# Patient Record
Sex: Male | Born: 1960 | Race: White | Hispanic: No | Marital: Married | Smoking: Never smoker
Health system: Southern US, Community
[De-identification: ages and names within clinical notes are randomized; demographics above are authoritative.]

## PROBLEM LIST (undated history)

## (undated) DIAGNOSIS — M1990 Unspecified osteoarthritis, unspecified site: Secondary | ICD-10-CM

## (undated) DIAGNOSIS — A4902 Methicillin resistant Staphylococcus aureus infection, unspecified site: Secondary | ICD-10-CM

## (undated) DIAGNOSIS — F419 Anxiety disorder, unspecified: Secondary | ICD-10-CM

## (undated) DIAGNOSIS — F329 Major depressive disorder, single episode, unspecified: Secondary | ICD-10-CM

## (undated) DIAGNOSIS — F32A Depression, unspecified: Secondary | ICD-10-CM

## (undated) HISTORY — DX: Anxiety disorder, unspecified: F41.9

## (undated) HISTORY — DX: Unspecified osteoarthritis, unspecified site: M19.90

## (undated) HISTORY — DX: Methicillin resistant Staphylococcus aureus infection, unspecified site: A49.02

## (undated) HISTORY — PX: DENTAL SURGERY: SHX609

---

## 1999-03-26 ENCOUNTER — Ambulatory Visit (HOSPITAL_COMMUNITY): Admission: RE | Admit: 1999-03-26 | Discharge: 1999-03-26 | Payer: Self-pay | Admitting: *Deleted

## 1999-03-26 ENCOUNTER — Encounter: Payer: Self-pay | Admitting: *Deleted

## 2000-01-11 ENCOUNTER — Emergency Department (HOSPITAL_COMMUNITY): Admission: EM | Admit: 2000-01-11 | Discharge: 2000-01-11 | Payer: Self-pay | Admitting: Emergency Medicine

## 2000-01-12 ENCOUNTER — Encounter: Payer: Self-pay | Admitting: Emergency Medicine

## 2011-12-02 ENCOUNTER — Ambulatory Visit (INDEPENDENT_AMBULATORY_CARE_PROVIDER_SITE_OTHER): Payer: 59 | Admitting: Family Medicine

## 2011-12-02 VITALS — BP 125/71 | HR 76 | Temp 98.5°F | Resp 16 | Ht 69.5 in | Wt 176.2 lb

## 2011-12-02 DIAGNOSIS — L02419 Cutaneous abscess of limb, unspecified: Secondary | ICD-10-CM

## 2011-12-02 DIAGNOSIS — L03119 Cellulitis of unspecified part of limb: Secondary | ICD-10-CM

## 2011-12-02 DIAGNOSIS — S61409A Unspecified open wound of unspecified hand, initial encounter: Secondary | ICD-10-CM

## 2011-12-02 MED ORDER — CIPROFLOXACIN HCL 500 MG PO TABS: 500.0000 mg | ORAL_TABLET | Freq: Two times a day (BID) | ORAL | Status: AC

## 2011-12-02 NOTE — Progress Notes (Signed)
  Patient Name: Nicholas Rodriguez Date of Birth: [birth year] Medical Record Number: [id number] Gender: male Date of Encounter: 12/02/2011  History of Present Illness:  Nicholas Rodriguez is a 51 y.o. very pleasant male patient who presents with the following:  Cut his right hand with a piece of glass on Friday night- today is Monday.  He does glass work regularly.  Still hurts some.  He notes no numbness in the hand.  He has not noted any weakness but he has not pushed himself to use his hand too much.  He also noted an area of ?cellulitis on his left calf 2 days ago.  Hurts a lot- reminds him of MRSA episodes.  This morning her used some heat to try and get it to drain.  He did "pop" it this am and drained some pus.    Otherwise he feels ok- no fever.   He had MRSA last august which was resistant to tetracyclines but susceptible to cipro He otherwise feels well- no fever- and is generally healthy  Tetanus shot was in 2012  There is no problem list on file for this patient.  No past medical history on file. No past surgical history on file. History  Substance Use Topics  . Smoking status: Never Smoker   . Smokeless tobacco: Not on file  . Alcohol Use: Not on file   No family history on file. No Known Allergies  Medication list has been reviewed and updated.  Review of Systems: As per HPI- otherwise negative.  Physical Examination: Filed Vitals:   12/02/11 1733  BP: 125/71  Pulse: 76  Temp: 98.5 F (36.9 C)  TempSrc: Oral  Resp: 16  Height: 5' 9.5" (1.765 m)  Weight: 176 lb 3.2 oz (79.924 kg)    Body mass index is 25.65 kg/(m^2).  GEN: WDWN, NAD, Non-toxic, A & O x 3 HEENT: Atraumatic, Normocephalic. Neck supple. No masses, No LAD. Ears and Nose: No external deformity. CV: RRR, No M/G/R. No JVD. No thrill. No extra heart sounds. PULM: CTA B, no wheezes, crackles, rhonchi. No retractions. No resp. distress. No accessory muscle use. ABD: S, NT, ND, +BS. No rebound. No  HSM. EXTR: No c/c/e NEURO Normal gait.  PSYCH: Normally interactive. Conversant. Not depressed or anxious appearing.  Calm demeanor.  Left calf has about 3inch diameter area of cellulitis with a central small wound- there is no fluctuance or pustule to culture.   Right hand has a healing laceration at the base of the ring finger.  It is already well closed.  The fingers have normal strength to flexion/ extension/ ab and adduction.  Normal sensation as well  Assessment and Plan: 1. Cellulitis, leg  ciprofloxacin (CIPRO) 500 MG tablet  2. Wound, open, hand with or without fingers      Hand wound seems to be healing well.  Offered referral to hand surgery, but he wants to see how it does.  Start cipro for cellulitis on leg as his last bout of mrsa was doxy resistant.  Continue heat- Patient (or parent if minor) instructed to return to clinic or call if not better in 2 day(s).  Sooner if worse.

## 2011-12-12 ENCOUNTER — Telehealth: Payer: Self-pay

## 2011-12-12 NOTE — Telephone Encounter (Signed)
Pt on cipro - would it be ok to switch him to different abx? Please advise.

## 2011-12-12 NOTE — Telephone Encounter (Signed)
Patient's last infection with MRSA was resistant to Doxycycline, so I am hesitant to make this change. Is he taking it with food? Is it diarrhea, nausea, or vomiting?  Carmon Sahli

## 2011-12-12 NOTE — Telephone Encounter (Signed)
Pt calling to ask if he could have another antibotic called in for his leg he said the one he is taking is tearing his stomach up

## 2011-12-13 NOTE — Telephone Encounter (Signed)
Pt just Nauseated and is taking the medication with food, pt is out of the cipro and if there is nothing else he can take in place of the cipro he states he will just deal with the nausea.

## 2011-12-13 NOTE — Telephone Encounter (Signed)
Pt reports his wound has really healed up a lot, but is not completely better. There is still a knot there and some redness, and is still sensitive to the touch. No draining/no fever. He feels like he needs the Abx a little longer for it to resolve, and if there is nothing else to use, he will take the Cipro and just deal with the nausea. Do you want to Rx something Dr Patsy Lager?

## 2011-12-13 NOTE — Telephone Encounter (Signed)
Called patient back- he still has redness, tenderness and heat.  I think he needs to come back in- may need I and D.  He agreed and will return tomorrow morning or Sunday morning if he cannot make it tomorrow

## 2011-12-15 ENCOUNTER — Ambulatory Visit (INDEPENDENT_AMBULATORY_CARE_PROVIDER_SITE_OTHER): Payer: 59 | Admitting: Physician Assistant

## 2011-12-15 ENCOUNTER — Telehealth: Payer: Self-pay | Admitting: Radiology

## 2011-12-15 VITALS — BP 122/67 | HR 47 | Temp 98.2°F | Resp 16 | Ht 69.75 in | Wt 175.0 lb

## 2011-12-15 DIAGNOSIS — L0390 Cellulitis, unspecified: Secondary | ICD-10-CM

## 2011-12-15 DIAGNOSIS — B9562 Methicillin resistant Staphylococcus aureus infection as the cause of diseases classified elsewhere: Secondary | ICD-10-CM

## 2011-12-15 DIAGNOSIS — L0291 Cutaneous abscess, unspecified: Secondary | ICD-10-CM

## 2011-12-15 DIAGNOSIS — L03119 Cellulitis of unspecified part of limb: Secondary | ICD-10-CM

## 2011-12-15 MED ORDER — SULFAMETHOXAZOLE-TRIMETHOPRIM 800-160 MG PO TABS: 1.0000 | ORAL_TABLET | Freq: Two times a day (BID) | ORAL | Status: AC

## 2011-12-15 NOTE — Telephone Encounter (Signed)
Pt needs appt with [facility] this Wed 12/18/11 in the am/ after 9 am, she needs to recheck his leg wound. See her with Questions, Shaconda Hajduk (phone [phone])

## 2011-12-15 NOTE — Patient Instructions (Signed)
See me Wednesday.  Follow wound care instructions

## 2011-12-15 NOTE — Progress Notes (Signed)
  Subjective:    Patient ID: Nicholas Rodriguez, male    DOB: 1961-02-23, 51 y.o.   MRN: [id number]  HPI50 yo CM here with unresolved abscess lower Left leg.  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 Patsy Lager had prescribed him for this most recent abscess.  The abscess wasn't fluctuant enough for I&D at that time, Cipro was started, and he finished it 3days ago.  Abscess seems to be fuller and more tender since finishing the cipro.  He did not tolerate the Cipro well; it caused him severe nausea. No f/c.  No pain in leg other than at location of abscess.  Review of Systems  All other systems reviewed and are negative.         Objective:   Physical Exam  Nursing note and vitals reviewed. Constitutional: He appears well-developed and well-nourished.  HENT:  Head: Normocephalic and atraumatic.  Skin:       Left lower leg-abscess ~ mid way down lateral calf ~3cm, erythema and slight induration, with mild central fluctuance.   Procedure-1% plain lidocaine 1.5 cc injected locally.  Betadine prep.  #11 blade to make 1 cm incision.  Some bloody, purulent drainage.  Culture taken.  1/4" plain guaze for packing.  Bandaged and ACE wrap    Assessment & Plan:  Cellulitis/abscess-C&S sent H/O Doxy resistant MRSA After reviewing culture results from 04/2011 and patient being unable to tolerate Cipro, will start Septra DS-1 bid, #3 tabs from stock bottle given.

## 2011-12-17 LAB — WOUND CULTURE
Gram Stain: NONE SEEN
Gram Stain: NONE SEEN
Gram Stain: NONE SEEN
Organism ID, Bacteria: NO GROWTH

## 2011-12-18 ENCOUNTER — Encounter: Payer: Self-pay | Admitting: Physician Assistant

## 2011-12-18 ENCOUNTER — Ambulatory Visit (INDEPENDENT_AMBULATORY_CARE_PROVIDER_SITE_OTHER): Payer: 59 | Admitting: Physician Assistant

## 2011-12-18 VITALS — BP 122/64 | HR 49 | Temp 97.7°F | Resp 16 | Ht 69.5 in | Wt 178.0 lb

## 2011-12-18 DIAGNOSIS — L02419 Cutaneous abscess of limb, unspecified: Secondary | ICD-10-CM

## 2011-12-18 NOTE — Progress Notes (Signed)
  Subjective:    Patient ID: Normajean Baxter, male    DOB: 25-Aug-1961, 51 y.o.   MRN: [id number]  HPI Here for leg abscess recheck.  Reviewed culture results which showed no growth.  Still having a lot of pain with it, esp if he is up walking, goes to climb a ladder, etc it feels as though its pulling apart. No f/c.  No streaking.  Denies any FB.  Review of Systems     Objective:   Physical Exam Removed dressing, packing still in place and opening has formed into an annular opening.  Packing is removed which is painful bc adhered to newly developing granulating tissue. Healing.  Much less surrounding erythema and skin near opening that was indurated is now soft. Muprircin 2% applied aseptically.  Redressed     Assessment & Plan:  Abscess-healing.  Continue antibiotics bc culture may have been compromised by recent antibiotics.  Gave some 2% mupircin to apply once daily.  No need to repack. Due to enlarging of incision site, it should heal well by secondary intention.  Elevate, relative rest X 2 days, recheck if not improving.

## 2012-01-25 ENCOUNTER — Ambulatory Visit: Payer: 59 | Admitting: Family Medicine

## 2012-01-25 VITALS — BP 115/68 | HR 50 | Temp 98.2°F | Resp 16 | Ht 71.0 in | Wt 175.0 lb

## 2012-01-25 DIAGNOSIS — L0291 Cutaneous abscess, unspecified: Secondary | ICD-10-CM

## 2012-01-25 DIAGNOSIS — L0390 Cellulitis, unspecified: Secondary | ICD-10-CM

## 2012-01-25 DIAGNOSIS — Z0289 Encounter for other administrative examinations: Secondary | ICD-10-CM

## 2012-01-25 MED ORDER — SULFAMETHOXAZOLE-TRIMETHOPRIM 800-160 MG PO TABS: 1.0000 | ORAL_TABLET | Freq: Two times a day (BID) | ORAL | Status: AC

## 2012-01-25 NOTE — Progress Notes (Signed)
  Subjective:    Patient ID: Nicholas Rodriguez, male    DOB: [birth year], 51 y.o.   MRN: [id number]  HPI 51 yo male here for physical exam for [employer].  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     Also, has suspicious spot on leg.  Multiple cases of MRSA abscess before.  IN August, it was doxy resistant.  Last was in April.  Took Bactrim.     Review of Systems Negative except as per HPI     Objective:   Physical Exam  Constitutional: Vital signs are normal. He appears well-developed and well-nourished. He is active.  HENT:  Head: Normocephalic.  Right Ear: Hearing, tympanic membrane, external ear and ear canal normal.  Left Ear: Hearing, tympanic membrane, external ear and ear canal normal.  Nose: Nose normal.  Mouth/Throat: Uvula is midline, oropharynx is clear and moist and mucous membranes are normal.  Eyes: Conjunctivae and EOM are normal. Pupils are equal, round, and reactive to light.  Neck: No mass and no thyromegaly present.  Cardiovascular: Normal rate, regular rhythm, normal heart sounds, intact distal pulses and normal pulses.   Pulmonary/Chest: Effort normal and breath sounds normal.  Abdominal: Soft. Normal appearance and bowel sounds are normal. There is no hepatosplenomegaly. There is no tenderness. There is no CVA tenderness. No hernia. Hernia confirmed negative in the right inguinal area and confirmed negative in the left inguinal area.  Genitourinary: Testes normal and penis normal.  Lymphadenopathy:    He has no cervical adenopathy.    He has no axillary adenopathy.  Neurological: He is alert.  Skin:       Right leg, over medial malleolus, small indurated, erythematous bump with surrounding erythema.  TTP.  No fluctuance.           Assessment & Plan:  PE - cleared for participation  Abscess with cellulitis - septra DS

## 2012-02-04 ENCOUNTER — Telehealth: Payer: Self-pay

## 2012-02-04 NOTE — Telephone Encounter (Signed)
Wife notified of immunizations

## 2012-02-04 NOTE — Telephone Encounter (Signed)
Patients wife is trying to fill out [employer] PE form and would like [name] to call her with dates of patients last Td and any Hep immunizations patient may of had here. Pt needs to turn in form today.

## 2012-03-20 ENCOUNTER — Telehealth: Payer: Self-pay

## 2012-03-20 NOTE — Telephone Encounter (Signed)
Patient believes that he has mrsa spot on left leg and would like to know if there is something that can be called in for him without having to come in for an office visit. Please contact on cell at [phone].

## 2012-03-21 ENCOUNTER — Ambulatory Visit: Payer: 59

## 2012-03-21 ENCOUNTER — Ambulatory Visit (INDEPENDENT_AMBULATORY_CARE_PROVIDER_SITE_OTHER): Payer: 59 | Admitting: Internal Medicine

## 2012-03-21 ENCOUNTER — Other Ambulatory Visit: Payer: Self-pay | Admitting: Internal Medicine

## 2012-03-21 VITALS — BP 120/70 | HR 56 | Temp 97.9°F | Resp 17 | Ht 70.0 in | Wt 169.0 lb

## 2012-03-21 DIAGNOSIS — M25529 Pain in unspecified elbow: Secondary | ICD-10-CM

## 2012-03-21 DIAGNOSIS — L03119 Cellulitis of unspecified part of limb: Secondary | ICD-10-CM

## 2012-03-21 DIAGNOSIS — B9562 Methicillin resistant Staphylococcus aureus infection as the cause of diseases classified elsewhere: Secondary | ICD-10-CM

## 2012-03-21 DIAGNOSIS — M7700 Medial epicondylitis, unspecified elbow: Secondary | ICD-10-CM

## 2012-03-21 MED ORDER — MUPIROCIN 2 % EX OINT: TOPICAL_OINTMENT | Freq: Three times a day (TID) | CUTANEOUS | Status: DC

## 2012-03-21 MED ORDER — SULFAMETHOXAZOLE-TRIMETHOPRIM 800-160 MG PO TABS: 1.0000 | ORAL_TABLET | Freq: Two times a day (BID) | ORAL | Status: AC

## 2012-03-21 NOTE — Progress Notes (Signed)
  Subjective:    Patient ID: Nicholas Rodriguez, male    DOB: November 13, 1960, 51 y.o.   MRN: [id number]  HPI Has new boil on left anterior thigh with pustule. Also c/o medial left elbow pain, tender to touch but not painful to use.   Review of Systems MRSA    Objective:   Physical Exam 1 cm abcess L thigh with pustule, c/s done after pus expressed L elbow point tender med epicondyle, has fullrom and NMS fuction with no pain  [facility] reading (PRIMARY) by  Dr.Guest medial lucency at tender area , no fx         Assessment & Plan:  MRSA Medial epicondylitis left elbow PT, home mrsa care and meds

## 2012-03-21 NOTE — Telephone Encounter (Signed)
Patient came in to be seen 

## 2012-03-21 NOTE — Patient Instructions (Addendum)
MRSA Overview MRSA stands for methicillin-resistant Staphylococcus aureus. It is a type of bacteria that is resistant to some common antibiotics. It can cause infections in the skin and many other places in the body. Staphylococcus aureus, often called "staph," is a bacteria that normally lives on the skin or in the nose. Staph on the surface of the skin or in the nose does not cause problems. However, if the staph enters the body through a cut, wound, or break in the skin, an infection can happen. Up until recently, infections with the MRSA type of staph mainly occurred in hospitals and other healthcare settings. There are now increasing problems with MRSA infections in the community as well. Infections with MRSA may be very serious or even life-threatening. Most MRSA infections are acquired in one of two ways:  Healthcare-associated MRSA (HA-MRSA)   This can be acquired by people in any healthcare setting. MRSA can be a big problem for hospitalized people, people in nursing homes, people in rehabilitation facilities, people with weakened immune systems, dialysis patients, and those who have had surgery.   Community-associated MRSA (CA-MRSA)   Community spread of MRSA is becoming more common. It is known to spread in crowded settings, in jails and prisons, and in situations where there is close skin-to-skin contact, such as during sporting events or in locker rooms. MRSA can be spread through shared items, such as children's toys, razors, towels, or sports equipment.  CAUSES  All staph, including MRSA, are normally harmless unless they enter the body through a scratch, cut, or wound, such as with surgery. All staph, including MRSA, can be spread from person-to-person by touching contaminated objects or through direct contact. SPECIAL GROUPS MRSA can present problems for special groups of people. Some of these groups include:  Breastfeeding women.   The most common problem is MRSA infection of the  breast (mastitis). There is evidence that MRSA can be passed to an infant from infected breast milk. Your caregiver may recommend that you stop breastfeeding until the mastitis is under control.   If you are breastfeeding and have a MRSA infection in a place other than the breast, you may usually continue breastfeeding while under treatment. If taking antibiotics, ask your caregiver if it is safe to continue breastfeeding while taking your prescribed medicines.   Neonates (babies from birth to 1 month old) and infants (babies from 1 month to 1 year old).   There is evidence that MRSA can be passed to a newborn at birth if the mother has MRSA on the skin, in or around the birth canal, or an infection in the uterus, cervix, or vagina. MRSA infection can have the same appearance as a normal newborn or infant rash or several other skin infections. This can make it hard to diagnose MRSA.   Immune compromised people.   If you have an immune system problem, you may have a higher chance of developing a MRSA infection.   People after any type of surgery.   Staph in general, including MRSA, is the most common cause of infections occurring at the site of recent surgery.   People on long-term steroid medicines.   These kinds of medicines can lower your resistance to infection. This can increase your chance of getting MRSA.   People who have had frequent hospitalizations, live in nursing homes or other residential care facilities, have venous or urinary catheters, or have taken multiple courses of antibiotic therapy for any reason.  DIAGNOSIS  Diagnosis of MRSA is   done by cultures of fluid samples that may come from:  Swabs taken from cuts or wounds in infected areas.   Nasal swabs.   Saliva or deep cough specimens from the lungs (sputum).   Urine.   Blood.  Many people are "colonized" with MRSA but have no signs of infection. This means that people carry the MRSA germ on their skin or in their  nose and may never develop MRSA infection.  TREATMENT  Treatment varies and is based on how serious, how deep, or how extensive the infection is. For example:  Some skin infections, such as a small boil or abscess, may be treated by draining yellowish-white fluid (pus) from the site of the infection.   Deeper or more widespread soft tissue infections are usually treated with surgery to drain pus and with antibiotic medicine given by vein or by mouth. This may be recommended even if you are pregnant.   Serious infections may require a hospital stay.  If antibiotics are given, they may be needed for several weeks. PREVENTION  Because many people are colonized with staph, including MRSA, preventing the spread of the bacteria from person-to-person is most important. The best way to prevent the spread of bacteria and other germs is through proper hand washing or by using alcohol-based hand disinfectants. The following are other ways to help prevent MRSA infection within the hospital and community settings.   Healthcare settings:   Strict hand washing or hand disinfection procedures need to be followed before and after touching every patient.   Patients infected with MRSA are placed in isolation to prevent the spread of the bacteria.   Healthcare workers need to wear disposable gowns and gloves when touching or caring for patients infected with MRSA. Visitors may also be asked to wear a gown and gloves.   Hospital surfaces need to be disinfected frequently.   Community settings:   Wash your hands frequently with soap and water for at least 15 seconds. Otherwise, use alcohol-based hand disinfectants when soap and water is not available.   Make sure people who live with you wash their hands often, too.   Do not share personal items. For example, avoid sharing razors and other personal hygiene items, towels, clothing, and athletic equipment.   Wash and dry your clothes and bedding at the  warmest temperatures recommended on the labels.   Keep wounds covered. Pus from infected sores may contain MRSA and other bacteria. Keep cuts and abrasions clean and covered with germ-free (sterile), dry bandages until they are healed.   If you have a wound that appears infected, ask your caregiver if a culture for MRSA and other bacteria should be done.   If you are breastfeeding, talk to your caregiver about MRSA. You may be asked to temporarily stop breastfeeding.  HOME CARE INSTRUCTIONS   Take your antibiotics as directed. Finish them even if you start to feel better.   Avoid close contact with those around you as much as possible. Do not use towels, razors, toothbrushes, bedding, or other items that will be used by others.   To fight the infection, follow your caregiver's instructions for wound care. Wash your hands before and after changing your bandages.   If you have an intravascular device, such as a catheter, make sure you know how to care for it.   Be sure to tell any healthcare providers that you have MRSA so they are aware of your infection.  SEEK IMMEDIATE MEDICAL CARE IF:     The infection appears to be getting worse. Signs include:   Increased warmth, redness, or tenderness around the wound site.   A red line that extends from the infection site.   A dark color in the area around the infection.   Wound drainage that is tan, yellow, or green.   A bad smell coming from the wound.   You feel sick to your stomach (nauseous) and throw up (vomit) or cannot keep medicine down.   You have a fever.   Your baby is older than 3 months with a rectal temperature of 102 F (38.9 C) or higher.   Your baby is 3 months old or younger with a rectal temperature of 100.4 F (38 C) or higher.   You have difficulty breathing.  MAKE SURE YOU:   Understand these instructions.   Will watch your condition.   Will get help right away if you are not doing well or get worse.    Document Released: 08/19/2005 Document Revised: 08/08/2011 Document Reviewed: 11/21/2010 [employer] Patient Information 2012 [employer]. 

## 2012-03-24 LAB — WOUND CULTURE: Gram Stain: NONE SEEN

## 2012-09-13 ENCOUNTER — Ambulatory Visit (INDEPENDENT_AMBULATORY_CARE_PROVIDER_SITE_OTHER): Payer: BC Managed Care – PPO | Admitting: Emergency Medicine

## 2012-09-13 VITALS — BP 130/74 | HR 66 | Temp 98.5°F | Resp 15 | Ht 69.5 in | Wt 167.0 lb

## 2012-09-13 DIAGNOSIS — L03119 Cellulitis of unspecified part of limb: Secondary | ICD-10-CM

## 2012-09-13 DIAGNOSIS — F4321 Adjustment disorder with depressed mood: Secondary | ICD-10-CM

## 2012-09-13 DIAGNOSIS — F5105 Insomnia due to other mental disorder: Secondary | ICD-10-CM

## 2012-09-13 DIAGNOSIS — F489 Nonpsychotic mental disorder, unspecified: Secondary | ICD-10-CM

## 2012-09-13 DIAGNOSIS — M5412 Radiculopathy, cervical region: Secondary | ICD-10-CM

## 2012-09-13 DIAGNOSIS — L02419 Cutaneous abscess of limb, unspecified: Secondary | ICD-10-CM

## 2012-09-13 MED ORDER — LORAZEPAM 1 MG PO TABS: 1.0000 mg | ORAL_TABLET | Freq: Three times a day (TID) | ORAL | Status: DC | PRN

## 2012-09-13 MED ORDER — SULFAMETHOXAZOLE-TRIMETHOPRIM 800-160 MG PO TABS: 1.0000 | ORAL_TABLET | Freq: Two times a day (BID) | ORAL | Status: DC

## 2012-09-13 MED ORDER — PAROXETINE HCL 20 MG PO TABS: 20.0000 mg | ORAL_TABLET | ORAL | Status: DC

## 2012-09-13 NOTE — Patient Instructions (Addendum)
Cervical Radiculopathy  Cervical radiculopathy happens when a nerve in the neck is pinched or bruised by a slipped (herniated) disk or by arthritic changes in the bones of the cervical spine. This can occur due to an injury or as part of the normal aging process. Pressure on the cervical nerves can cause pain or numbness that runs from your neck all the way down into your arm and fingers.  CAUSES   There are many possible causes, including:   Injury.   Muscle tightness in the neck from overuse.   Swollen, painful joints (arthritis).   Breakdown or degeneration in the bones and joints of the spine (spondylosis) due to aging.   Bone spurs that may develop near the cervical nerves.  SYMPTOMS   Symptoms include pain, weakness, or numbness in the affected arm and hand. Pain can be severe or irritating. Symptoms may be worse when extending or turning the neck.  DIAGNOSIS   Your caregiver will ask about your symptoms and do a physical exam. He or she may test your strength and reflexes. X-rays, CT scans, and MRI scans may be needed in cases of injury or if the symptoms do not go away after a period of time. Electromyography (EMG) or nerve conduction testing may be done to study how your nerves and muscles are working.  TREATMENT   Your caregiver may recommend certain exercises to help relieve your symptoms. Cervical radiculopathy can, and often does, get better with time and treatment. If your problems continue, treatment options may include:   Wearing a soft collar for short periods of time.   Physical therapy to strengthen the neck muscles.   Medicines, such as nonsteroidal anti-inflammatory drugs (NSAIDs), oral corticosteroids, or spinal injections.   Surgery. Different types of surgery may be done depending on the cause of your problems.  HOME CARE INSTRUCTIONS    Put ice on the affected area.   Put ice in a plastic bag.   Place a towel between your skin and the bag.   Leave the ice on for 15 to 20  minutes, 3 to 4 times a day or as directed by your caregiver.   If ice does not help, you can try using heat. Take a warm shower or bath, or use a hot water bottle as directed by your caregiver.   You may try a gentle neck and shoulder massage.   Use a flat pillow when you sleep.   Only take over-the-counter or prescription medicines for pain, discomfort, or fever as directed by your caregiver.   If physical therapy was prescribed, follow your caregiver's directions.   If a soft collar was prescribed, use it as directed.  SEEK IMMEDIATE MEDICAL CARE IF:    Your pain gets much worse and cannot be controlled with medicines.   You have weakness or numbness in your hand, arm, face, or leg.   You have a high fever or a stiff, rigid neck.   You lose bowel or bladder control (incontinence).   You have trouble with walking, balance, or speaking.  MAKE SURE YOU:    Understand these instructions.   Will watch your condition.   Will get help right away if you are not doing well or get worse.  Document Released: 05/14/2001 Document Revised: 11/11/2011 Document Reviewed: 04/02/2011  [employer] Patient Information 2013 [employer].

## 2012-09-13 NOTE — Progress Notes (Signed)
[facility] [address], [place] [address] [phone]  Date:  09/13/2012   Name:  Nicholas Rodriguez   DOB:  1961-03-25   MRN:  [id number]  PCP:  No primary provider on file.    Chief Complaint: Recurrent Skin Infections, Neck Pain, Shoulder Pain, Elbow Pain, Wrist Pain and Extremity Weakness   History of Present Illness:  Nicholas Rodriguez is a 52 y.o. very pleasant male patient who presents with the following:  History of MRSA and is concerned that he has a recurrent infection on his left thigh.  No fever or chills.  No history of injury.  Has long history of pain in right neck that radiates down the arm into his hand not in a radicular distribution.  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  Denies improvement now with use of OTC NSAID's requesting a "steroid shot".  Says he received a local injection in the shoulder that resolved this once previously in right shoulder.  Has separated from his wife of 24 years and is not sleeping or eating as a consequence of the stress.  Very tearful.  Has an appt with therapist this week.   There is no problem list on file for this patient.   No past medical history on file.  No past surgical history on file.  History  Substance Use Topics  . Smoking status: Never Smoker   . Smokeless tobacco: Never Used  . Alcohol Use: Not on file    No family history on file.  No Known Allergies  Medication list has been reviewed and updated.  Current Outpatient Prescriptions on File Prior to Visit  Medication Sig Dispense Refill  . ibuprofen (ADVIL,MOTRIN) 200 MG tablet Take 600 mg by mouth 4 (four) times daily.      Marland Kitchen HYDROcodone-acetaminophen (VICODIN) 5-500 MG per tablet Take 1 tablet by mouth every 6 (six) hours as needed.        Review of Systems:  As per HPI, otherwise negative.    Physical Examination: Filed  Vitals:   09/13/12 1408  BP: 130/74  Pulse: 66  Temp: 98.5 F (36.9 C)  Resp: 15   Filed Vitals:   09/13/12 1408  Height: 5' 9.5" (1.765 m)  Weight: 167 lb (75.751 kg)   Body mass index is 24.31 kg/(m^2). Ideal Body Weight: Weight in (lb) to have BMI = 25: 171.4   GEN: WDWN, NAD, Non-toxic, A & O x 3 HEENT: Atraumatic, Normocephalic. Neck supple. No masses, No LAD. Ears and Nose: No external deformity. CV: RRR, No M/G/R. No JVD. No thrill. No extra heart sounds. PULM: CTA B, no wheezes, crackles, rhonchi. No retractions. No resp. distress. No accessory muscle use. ABD: S, NT, ND, +BS. No rebound. No HSM. EXTR: No c/c/e  Right shoulder and posterior right neck tender NEURO Normal gait. Weakness right arm and hand grip PSYCH: Normally interactive. Conversant. Tearful and anxious appearing.  Calm demeanor.  SKIN:  Multiple small abscesses left thigh no cellulitis  Assessment and Plan: MRSA Cervical neuritis with right arm weakness Situational depression   paxil antivan MRI neck Septra Follow up after studies  Carmelina Dane, MD

## 2012-09-23 ENCOUNTER — Other Ambulatory Visit: Payer: Self-pay | Admitting: Emergency Medicine

## 2012-09-23 DIAGNOSIS — M5412 Radiculopathy, cervical region: Secondary | ICD-10-CM

## 2012-09-23 DIAGNOSIS — Z139 Encounter for screening, unspecified: Secondary | ICD-10-CM

## 2012-09-29 ENCOUNTER — Ambulatory Visit
Admission: RE | Admit: 2012-09-29 | Discharge: 2012-09-29 | Disposition: A | Discharge status: Home / Self Care | Payer: BC Managed Care – PPO | Source: Ambulatory Visit | Attending: Emergency Medicine | Admitting: Emergency Medicine

## 2012-09-29 DIAGNOSIS — M5412 Radiculopathy, cervical region: Secondary | ICD-10-CM

## 2012-09-29 DIAGNOSIS — Z139 Encounter for screening, unspecified: Secondary | ICD-10-CM

## 2012-12-23 ENCOUNTER — Ambulatory Visit (INDEPENDENT_AMBULATORY_CARE_PROVIDER_SITE_OTHER): Payer: BC Managed Care – PPO | Admitting: Family Medicine

## 2012-12-23 VITALS — BP 128/74 | HR 54 | Temp 98.3°F | Resp 16 | Ht 71.0 in | Wt 169.0 lb

## 2012-12-23 DIAGNOSIS — R52 Pain, unspecified: Secondary | ICD-10-CM

## 2012-12-23 DIAGNOSIS — L03317 Cellulitis of buttock: Secondary | ICD-10-CM

## 2012-12-23 DIAGNOSIS — L0231 Cutaneous abscess of buttock: Secondary | ICD-10-CM

## 2012-12-23 MED ORDER — SULFAMETHOXAZOLE-TRIMETHOPRIM 800-160 MG PO TABS: 1.0000 | ORAL_TABLET | Freq: Two times a day (BID) | ORAL | Status: DC

## 2012-12-23 MED ORDER — TRAMADOL HCL 50 MG PO TABS: 50.0000 mg | ORAL_TABLET | Freq: Three times a day (TID) | ORAL | Status: DC | PRN

## 2012-12-23 MED ORDER — KETOROLAC TROMETHAMINE 60 MG/2ML IM SOLN
60.0000 mg | Freq: Once | INTRAMUSCULAR | Status: AC
  Administered 2012-12-23: 60 mg via INTRAMUSCULAR

## 2012-12-23 MED ORDER — HYDROXYZINE HCL 25 MG PO TABS: 25.0000 mg | ORAL_TABLET | Freq: Three times a day (TID) | ORAL | Status: DC | PRN

## 2012-12-23 NOTE — Patient Instructions (Signed)
I am giving you a medicine for pain called tramadol. I am giving you bactrim again as a antibiotic.  Take as directed. I am also giving you a medicine to keep you from picking  Come back in 1 week to make sure you are getting better or sooner if you have any fever or wrosening redness.

## 2012-12-23 NOTE — Progress Notes (Signed)
Patient is a 52 year old male with a history of MRSA coming in with new but he believes is an abscess on his right buttocks. Patient states this does bring up over the last 24 hours. Patient denies any fevers or chills. Patient denies any discharge at this time. Patient is unaware he continues to get these infections. Patient is touch in his legs, but he does seem to take and does have some anxiety at baseline.  Past medical history, social, surgical and family history all reviewed.   Physical exam Blood pressure 128/74, pulse 54, temperature 98.3 F (36.8 C), resp. rate 16, height 5\' 11"  (1.803 m), weight 169 lb (76.658 kg), SpO2 99.00%. Skin exam: On inspection patient does have areas that has very small abscess formation. We'll somewhat fluctuant but only mildly. Patient does have a large wound approximately 1 cm on the right buttocks. This is very tender to palpation. There is no stress signs of cellulitis at this time.  After verbal and written consent patient did have prepped with alcohol swabs over the abscess on his buttocks. Patient did have ethyl chloride sprayed and did have a small incision made with a eleven blade. Patient tolerated the procedure well and did get some frank pus out of the area as was a mild blood. This was then bandage within gauze and tape.  Assessment: Abscess of the right buttocks with history of MRSA.  Plan: The patient was given an injection of portal for pain relief today. Patient was given a prescription for tramadol as well as given a prescription for Bactrim for the infection. Patient also given hydroxyzine for relieving itch and hopefully less picking.  Return in 1 week for further evaluation. Discussed warm compresses and when to seek medical attention.

## 2013-05-17 ENCOUNTER — Ambulatory Visit (INDEPENDENT_AMBULATORY_CARE_PROVIDER_SITE_OTHER): Payer: BC Managed Care – PPO | Admitting: Family Medicine

## 2013-05-17 VITALS — BP 130/80 | HR 54 | Temp 98.6°F | Resp 16 | Ht 72.0 in | Wt 179.4 lb

## 2013-05-17 DIAGNOSIS — M79609 Pain in unspecified limb: Secondary | ICD-10-CM

## 2013-05-17 DIAGNOSIS — IMO0001 Reserved for inherently not codable concepts without codable children: Secondary | ICD-10-CM

## 2013-05-17 DIAGNOSIS — L02519 Cutaneous abscess of unspecified hand: Secondary | ICD-10-CM

## 2013-05-17 MED ORDER — TRAMADOL HCL 50 MG PO TABS: 50.0000 mg | ORAL_TABLET | Freq: Four times a day (QID) | ORAL | Status: DC | PRN

## 2013-05-17 MED ORDER — SULFAMETHOXAZOLE-TRIMETHOPRIM 800-160 MG PO TABS: 1.0000 | ORAL_TABLET | Freq: Two times a day (BID) | ORAL | Status: DC

## 2013-05-17 NOTE — Progress Notes (Signed)
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 He has a history of having several per our episodes of MRSA infections, usually lower extremities. A couple of days ago a place started coming up on his left index finger and it continues to persist. It's very painful. Last night he opened it up and squeezed out some pus, which hurt intensely. The back of the hand is swelling, and the finger swelling a little bit, but the erythema is all overlying the back of the proximal phalanx.  Objective: Erythematous abscess on back of left index finger as noted above. This was noted to have a tiny whitehead on it which was opened with a needle and a small amount of pus expressed which was cultured. The pain is intense.  Assessment: Abscess, probably MRSA, left index finger Pain finger  Plan: Tramadol for pain Bactrim twice daily for infection Return if in all worse Use warm compresses The patient is going to work but he is to try and limit activity with it and. I'm sure the pain will limit activity.

## 2013-05-17 NOTE — Patient Instructions (Addendum)
Take the antibiotic twice daily as directed  Take the pain pills every 6 hours as needed  Continue to use warm compresses  Return if in all worse

## 2013-05-19 LAB — WOUND CULTURE

## 2013-05-23 ENCOUNTER — Other Ambulatory Visit: Payer: Self-pay | Admitting: Family Medicine

## 2013-05-23 ENCOUNTER — Telehealth: Payer: Self-pay | Admitting: Family Medicine

## 2013-05-23 DIAGNOSIS — L02512 Cutaneous abscess of left hand: Secondary | ICD-10-CM

## 2013-05-23 MED ORDER — DOXYCYCLINE HYCLATE 100 MG PO TABS: 100.0000 mg | ORAL_TABLET | Freq: Two times a day (BID) | ORAL | Status: DC

## 2013-05-23 NOTE — Progress Notes (Signed)
Spoke to patient and called doxycycline in for him.

## 2013-05-26 ENCOUNTER — Other Ambulatory Visit: Payer: Self-pay | Admitting: Internal Medicine

## 2013-05-30 NOTE — Telephone Encounter (Signed)
Opened by error.

## 2013-07-31 ENCOUNTER — Other Ambulatory Visit: Payer: Self-pay | Admitting: Family Medicine

## 2013-08-03 ENCOUNTER — Ambulatory Visit (INDEPENDENT_AMBULATORY_CARE_PROVIDER_SITE_OTHER): Payer: BC Managed Care – PPO | Admitting: Physician Assistant

## 2013-08-03 VITALS — BP 110/68 | HR 70 | Temp 98.3°F | Resp 16 | Ht 71.3 in | Wt 183.8 lb

## 2013-08-03 DIAGNOSIS — L0390 Cellulitis, unspecified: Secondary | ICD-10-CM

## 2013-08-03 DIAGNOSIS — L0291 Cutaneous abscess, unspecified: Secondary | ICD-10-CM

## 2013-08-03 DIAGNOSIS — A4902 Methicillin resistant Staphylococcus aureus infection, unspecified site: Secondary | ICD-10-CM

## 2013-08-03 MED ORDER — DOXYCYCLINE HYCLATE 100 MG PO CAPS: 100.0000 mg | ORAL_CAPSULE | Freq: Two times a day (BID) | ORAL | Status: DC

## 2013-08-03 NOTE — Progress Notes (Signed)
Patient ID: Nicholas NISHIYAMA MRN: [id number], DOB: October 16, 1960, 52 y.o. Date of Encounter: 08/03/2013, 9:04 AM  Primary Physician: Tonye Pearson, MD  Chief Complaint: Possible MRSA x 4 days  HPI: 52 y.o. male with history of MRSA presents with 4 day history of erythematous lesion along the left anterior distal thigh. He has been expressing some purulence from a pinpoint pustule along the lesion after his showers each morning. Performing hot compresses daily. Afebrile. No chills. No nausea or vomiting. Last episode of MRSA was in September, resistant to Bactrim. Usually able to treat successfully without I&D. He does not want to have I&D performed today. Prefers just antibiotics. Lesion is sore to the touch. Has been applying left over mupirocin ointment to the lesion. FROM of the knee which is distal to the lesion, but it does fill stiff or swollen.      Past Medical History  Diagnosis Date  . MRSA (methicillin resistant Staphylococcus aureus)      Home Meds: Prior to Admission medications   Medication Sig Start Date End Date Taking? Authorizing Provider                                                     Allergies: No Known Allergies  History   Social History  . Marital Status: Married    Spouse Name: N/A    Number of Children: N/A  . Years of Education: N/A   Occupational History  . Not on file.   Social History Main Topics  . Smoking status: Never Smoker   . Smokeless tobacco: Never Used  . Alcohol Use: Yes  . Drug Use: Yes  . Sexual Activity: Yes     Comment: condom   Other Topics Concern  . Not on file   Social History Narrative  . No narrative on file     Review of Systems: Constitutional: negative for chills, fever, or fatigue  HEENT: negative for vision changes or hearing loss Abdominal: negative for abdominal pain, nausea, vomiting, or diarrhea Dermatological: see above Neurologic: negative for headache   Physical Exam: Blood pressure  110/68, pulse 70, temperature 98.3 F (36.8 C), temperature source Oral, resp. rate 16, height 5' 11.3" (1.811 m), weight 183 lb 12.8 oz (83.371 kg), SpO2 99.00%., Body mass index is 25.42 kg/(m^2). General: Well developed, well nourished, in no acute distress. Head: Normocephalic, atraumatic, eyes without discharge, sclera non-icteric, nares are without discharge.   Neck: Supple. Full ROM.  Lungs: Breathing is unlabored. Heart: Regular rate. Msk:  Strength and tone normal for age.  Extremities/Skin: Warm and dry. No clubbing or cyanosis. No edema. Left distal anterior thigh: 2 cm x 3 cm erythematous lesion with central induration. No fluctuance. There is a central pinpoint pustule. Lesion is TTP. Verbal consent obtained. Betadine prep obtained. 25 gauge needle used to unroof small pustule. Culture taken. Wound dressed.  Neuro: Alert and oriented X 3. Moves all extremities spontaneously. Gait is normal. CNII-XII grossly in tact. Psych:  Responds to questions appropriately with a normal affect.   Labs: Wound culture pending  ASSESSMENT AND PLAN:  52 y.o. male with cellulitis of the left leg, likely MRSA  -No current fluctuance indicating need to I&D at this time -Doxycycline 100 mg 1 po bid #20 no RF -Continue hot compresses  -If worsens RTC for re-evaluation and  possible I&D -MRSA education provided  -RTC precautions  Signed, Eula Listen, PA-C [facility] [place], [place] [address] [phone] 08/03/2013 9:04 AM

## 2013-08-05 ENCOUNTER — Other Ambulatory Visit: Payer: Self-pay | Admitting: Physician Assistant

## 2013-08-05 DIAGNOSIS — Z22322 Carrier or suspected carrier of Methicillin resistant Staphylococcus aureus: Secondary | ICD-10-CM

## 2013-08-05 LAB — WOUND CULTURE
Gram Stain: NONE SEEN
Gram Stain: NONE SEEN

## 2013-08-05 MED ORDER — CIPROFLOXACIN HCL 500 MG PO TABS: 500.0000 mg | ORAL_TABLET | Freq: Two times a day (BID) | ORAL | Status: DC

## 2013-10-09 ENCOUNTER — Ambulatory Visit: Payer: BC Managed Care – PPO

## 2013-10-09 ENCOUNTER — Ambulatory Visit (INDEPENDENT_AMBULATORY_CARE_PROVIDER_SITE_OTHER): Payer: BC Managed Care – PPO | Admitting: Family Medicine

## 2013-10-09 VITALS — BP 122/70 | HR 59 | Temp 99.2°F | Resp 18 | Ht 69.5 in | Wt 178.0 lb

## 2013-10-09 DIAGNOSIS — R6883 Chills (without fever): Secondary | ICD-10-CM

## 2013-10-09 DIAGNOSIS — M25512 Pain in left shoulder: Secondary | ICD-10-CM

## 2013-10-09 DIAGNOSIS — M25519 Pain in unspecified shoulder: Secondary | ICD-10-CM

## 2013-10-09 LAB — POCT CBC
Granulocyte percent: 57.6 % (ref 37–80)
HCT, POC: 49.4 % (ref 43.5–53.7)
Hemoglobin: 15.4 g/dL (ref 14.1–18.1)
Lymph, poc: 2 (ref 0.6–3.4)
MCH, POC: 29.1 pg (ref 27–31.2)
MCHC: 31.2 g/dL — AB (ref 31.8–35.4)
MCV: 93.3 fL (ref 80–97)
MID (cbc): 0.8 (ref 0–0.9)
MPV: 9 fL (ref 0–99.8)
POC Granulocyte: 3.7 (ref 2–6.9)
POC LYMPH PERCENT: 30.1 % (ref 10–50)
POC MID %: 12.3 % — AB (ref 0–12)
Platelet Count, POC: 233 10*3/uL (ref 142–424)
RBC: 5.29 M/uL (ref 4.69–6.13)
RDW, POC: 14.1 %
WBC: 6.5 10*3/uL (ref 4.6–10.2)

## 2013-10-09 MED ORDER — METHOCARBAMOL 500 MG PO TABS: 500.0000 mg | ORAL_TABLET | Freq: Three times a day (TID) | ORAL | Status: DC | PRN

## 2013-10-09 MED ORDER — OXYCODONE-ACETAMINOPHEN 5-325 MG PO TABS: 1.0000 | ORAL_TABLET | Freq: Three times a day (TID) | ORAL | Status: DC | PRN

## 2013-10-09 MED ORDER — MELOXICAM 15 MG PO TABS: 15.0000 mg | ORAL_TABLET | Freq: Every day | ORAL | Status: DC

## 2013-10-09 NOTE — Progress Notes (Signed)
Chief Complaint:  Chief Complaint  Patient presents with  . Shoulder Pain    left pain since wednesday    HPI: Nicholas Rodriguez is a 53 y.o. male who is here for: Left shoulder pain that started Wednesday morning, He states that he reached up into his kitchen cabinet and felt a pull. He states he has been taking Aleve without successful releif. Denies SOB, CP.  6/10 pain  with no activity. No other complaints. No prior  left shoulder injury. Cant rotate his shoulder. 8/10 pain sitting. Right now 5/10. But if he moves it it is " off the chart".  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 He is right handed.   Prior to this he had URI sxs and chills but that has resolved.   Past Medical History  Diagnosis Date  . MRSA (methicillin resistant Staphylococcus aureus)    Past Surgical History  Procedure Laterality Date  . Dental surgery     History   Social History  . Marital Status: Married    Spouse Name: N/A    Number of Children: N/A  . Years of Education: N/A   Social History Main Topics  . Smoking status: Never Smoker   . Smokeless tobacco: Never Used  . Alcohol Use: Yes     Comment: social  . Drug Use: Yes    Special: Marijuana  . Sexual Activity: Yes     Comment: condom   Other Topics Concern  . None   Social History Narrative  . None   History reviewed. No pertinent family history. No Known Allergies Prior to Admission medications   Medication Sig Start Date End Date Taking? Authorizing Provider  ibuprofen (ADVIL,MOTRIN) 200 MG tablet Take 600 mg by mouth 4 (four) times daily.   Yes Historical Provider, MD  traMADol (ULTRAM) 50 MG tablet Take 1 tablet (50 mg total) by mouth every 6 (six) hours as needed for pain. 05/17/13  Yes Posey Boyer, MD  ciprofloxacin (CIPRO) 500 MG tablet Take 1 tablet (500 mg total) by mouth 2 (two) times daily. 08/05/13   Rise Mu, PA-C  hydrOXYzine (ATARAX/VISTARIL) 25 MG tablet Take 1 tablet (25 mg total) by mouth  every 8 (eight) hours as needed for itching. 12/23/12   Lyndal Pulley, DO  LORazepam (ATIVAN) 1 MG tablet Take 1 tablet (1 mg total) by mouth every 8 (eight) hours as needed for anxiety. 09/13/12   Ellison Carwin, MD  mupirocin ointment (BACTROBAN) 2 % APPLY TOPICALLY 3 TIMES DAILY. 05/26/13   Heather Elnora Morrison, PA-C  PARoxetine (PAXIL) 20 MG tablet Take 1 tablet (20 mg total) by mouth every morning. 09/13/12   Ellison Carwin, MD     ROS: The patient denies fevers, chills, night sweats, unintentional weight loss, chest pain, palpitations, wheezing, dyspnea on exertion, nausea, vomiting, abdominal pain, dysuria, hematuria, melena, numbness, weakness, or tingling.   All other systems have been reviewed and were otherwise negative with the exception of those mentioned in the HPI and as above.    PHYSICAL EXAM: Filed Vitals:   10/09/13 1802  BP: 122/70  Pulse: 59  Temp: 99.2 F (37.3 C)  Resp: 18   Filed Vitals:   10/09/13 1802  Height: 5' 9.5" (1.765 m)  Weight: 178 lb (80.74 kg)   Body mass index is 25.92 kg/(m^2).  General: Alert, no acute distress HEENT:  Normocephalic, atraumatic, oropharynx patent. EOMI, PERRLA Cardiovascular:  Regular rate and rhythm, no  rubs murmurs or gallops.  No Carotid bruits, radial pulse intact. No pedal edema.  Respiratory: Clear to auscultation bilaterally.  No wheezes, rales, or rhonchi.  No cyanosis, no use of accessory musculature GI: No organomegaly, abdomen is soft and non-tender, positive bowel sounds.  No masses. Skin: No rashes. Neurologic: Facial musculature symmetric. Psychiatric: Patient is appropriate throughout our interaction. Lymphatic: No cervical lymphadenopathy Musculoskeletal: Gait intact. LEft shoulder-no deformities, unable to raise his arm above shoulder height, neg empty can, sensation intact. Tender along lateral and posterior shoulder.  Pain causes decrease ROM in ER and IR and also subscap testing. NO e/o selling, infection,  warmth in jt   LABS: Results for orders placed in visit on 10/09/13  POCT CBC      Result Value Range   WBC 6.5  4.6 - 10.2 K/uL   Lymph, poc 2.0  0.6 - 3.4   POC LYMPH PERCENT 30.1  10 - 50 %L   MID (cbc) 0.8  0 - 0.9   POC MID % 12.3 (*) 0 - 12 %M   POC Granulocyte 3.7  2 - 6.9   Granulocyte percent 57.6  37 - 80 %G   RBC 5.29  4.69 - 6.13 M/uL   Hemoglobin 15.4  14.1 - 18.1 g/dL   HCT, POC 49.4  43.5 - 53.7 %   MCV 93.3  80 - 97 fL   MCH, POC 29.1  27 - 31.2 pg   MCHC 31.2 (*) 31.8 - 35.4 g/dL   RDW, POC 14.1     Platelet Count, POC 233  142 - 424 K/uL   MPV 9.0  0 - 99.8 fL     EKG/XRAY:   Primary read interpreted by Dr. Marin Comment at [facility] 2. No fx/dislocation   ASSESSMENT/PLAN: Encounter Diagnoses  Name Primary?  . Shoulder pain, left Yes  . Chills    Rotator cuff etiology vs less likely bursitis?  Unlikely infectious  CBC was WNL WIll do conservative treatment with mobic, robaxin and percocet If he continues to have pain then I will refer to ortho, he will call me F/u prn   Gross sideeffects, risk and benefits, and alternatives of medications d/w patient. Patient is aware that all medications have potential sideeffects and we are unable to predict every sideeffect or drug-drug interaction that may occur.  LE, Osino, DO 10/09/2013 7:27 PM

## 2014-02-04 ENCOUNTER — Ambulatory Visit (INDEPENDENT_AMBULATORY_CARE_PROVIDER_SITE_OTHER): Payer: BC Managed Care – PPO | Admitting: Family Medicine

## 2014-02-04 VITALS — BP 112/82 | HR 56 | Temp 98.4°F | Resp 16 | Ht 70.0 in | Wt 180.6 lb

## 2014-02-04 DIAGNOSIS — L03019 Cellulitis of unspecified finger: Secondary | ICD-10-CM

## 2014-02-04 DIAGNOSIS — Z8614 Personal history of Methicillin resistant Staphylococcus aureus infection: Secondary | ICD-10-CM

## 2014-02-04 DIAGNOSIS — L02519 Cutaneous abscess of unspecified hand: Secondary | ICD-10-CM

## 2014-02-04 DIAGNOSIS — F329 Major depressive disorder, single episode, unspecified: Secondary | ICD-10-CM

## 2014-02-04 MED ORDER — MUPIROCIN 2 % EX OINT: TOPICAL_OINTMENT | CUTANEOUS | Status: DC

## 2014-02-04 MED ORDER — FLUOXETINE HCL 20 MG PO TABS: 20.0000 mg | ORAL_TABLET | Freq: Every day | ORAL | Status: DC

## 2014-02-04 MED ORDER — CIPROFLOXACIN HCL 500 MG PO TABS: 500.0000 mg | ORAL_TABLET | Freq: Two times a day (BID) | ORAL | Status: DC

## 2014-02-04 NOTE — Patient Instructions (Addendum)
We are going to treat you with cipro for your finger infection. If your finger is not getting better or if is getting worse please call or come back!   Also, use the mupirocin in your nose twice a day for 5 days.  You can apply this to your finger as well.  Try doing a dilute bleach bath solution.    When decolonization is attempted, the IDSA guidelines recommend dilute bleach baths for 15 minutes twice weekly for 3 months using a formula of 1 teaspoon of bleach per gallon of water, or one-quarter cup per 13 gallons or one-quarter bath tub  We are going to start prozac 20 mg.  After a couple of weeks you can go up to 40mg  a day.  Please give me a call or email in 2 or 3 weeks and let me know how you are doing.   Be sure to get exercise and spend time in the sunshine daily, and try to stick to a consistent sleep/ wake schedule and healthy diet.

## 2014-02-04 NOTE — Progress Notes (Addendum)
[facility] [address], [place] [address] [phone]  Date:  02/04/2014   Name:  Nicholas Rodriguez   DOB:  30-Jun-1961   MRN:  [id number]  PCP:  Leandrew Koyanagi, MD    Chief Complaint: Finger Infection   History of Present Illness:  Nicholas Rodriguez is a 53 y.o. very pleasant male patient who presents with the following:  He noted a possible infection in his left long finger about 5 days ago.  No knonw injury- just came up.  He has tended to get MRSA cellulitis about once a year here over the last few years.  He did have a resistant MRSA back in January; he was treated with cipro.  He is generally healthy.  Otherwise feels well, no fever.   Also admits he has felt down, sad for about one year.  He and his wife are separated, and he had another relationship with a woman that ended recently.  He and his wife have a 9 year old son who they are co-parenting. They have very different parenting styles and this causes some conflict in the family.  He is also not seeing his son as much as he would like.  He is having some trouble with rumination and persistent negative thoughts.  He denies any SI  There are no active problems to display for this patient.   Past Medical History  Diagnosis Date  . MRSA (methicillin resistant Staphylococcus aureus)     Past Surgical History  Procedure Laterality Date  . Dental surgery      History  Substance Use Topics  . Smoking status: Never Smoker   . Smokeless tobacco: Never Used  . Alcohol Use: Yes     Comment: social    No family history on file.  No Known Allergies  Medication list has been reviewed and updated.  Current Outpatient Prescriptions on File Prior to Visit  Medication Sig Dispense Refill  . mupirocin ointment (BACTROBAN) 2 % APPLY TOPICALLY 3 TIMES DAILY.  22 g  1  . meloxicam (MOBIC) 15 MG tablet Take 1 tablet (15 mg total) by mouth daily.  30 tablet  0  . methocarbamol (ROBAXIN) 500 MG tablet  Take 1 tablet (500 mg total) by mouth every 8 (eight) hours as needed for muscle spasms.  30 tablet  0  . oxyCODONE-acetaminophen (ROXICET) 5-325 MG per tablet Take 1-2 tablets by mouth every 8 (eight) hours as needed for severe pain.  30 tablet  0   No current facility-administered medications on file prior to visit.    Review of Systems:  As per HPI- otherwise negative.   Physical Examination: Filed Vitals:   02/04/14 0840  BP: 112/82  Pulse: 56  Temp: 98.4 F (36.9 C)  Resp: 16   Filed Vitals:   02/04/14 0840  Height: 5\' 10"  (1.778 m)  Weight: 180 lb 9.6 oz (81.92 kg)   Body mass index is 25.91 kg/(m^2). Ideal Body Weight: Weight in (lb) to have BMI = 25: 173.9  GEN: WDWN, NAD, Non-toxic, A & O x 3, looks well HEENT: Atraumatic, Normocephalic. Neck supple. No masses, No LAD. Ears and Nose: No external deformity. CV: RRR, No M/G/R. No JVD. No thrill. No extra heart sounds. PULM: CTA B, no wheezes, crackles, rhonchi. No retractions. No resp. distress. No accessory muscle use. ABD: S, NT, ND, +BS. No rebound. No HSM. EXTR: No c/c/e NEURO Normal gait.  PSYCH: Normally interactive. Conversant. Not depressed  or anxious appearing.  Calm demeanor.  Left long finger: he has a tender area over the dorsal missle phalanx.  Able to express some pus for culture.  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 Treat with cipro BID for 10 days Start prozac for depression at 20 mg- plan to check in on the phone in a couple of weeks Also discussed strategies to eradicate MRSA  Signed Lamar Blinks, MD  6/9: called to go over his labs. He does have MRSA, but sensitive to  septra and cipro this time. LMOM- let me know if not better.

## 2014-02-06 LAB — WOUND CULTURE
GRAM STAIN: NONE SEEN
GRAM STAIN: NONE SEEN
GRAM STAIN: NONE SEEN

## 2014-03-06 ENCOUNTER — Ambulatory Visit (INDEPENDENT_AMBULATORY_CARE_PROVIDER_SITE_OTHER): Payer: BC Managed Care – PPO | Admitting: Emergency Medicine

## 2014-03-06 VITALS — BP 126/68 | HR 69 | Temp 98.0°F | Resp 16 | Ht 69.5 in | Wt 176.0 lb

## 2014-03-06 DIAGNOSIS — L03115 Cellulitis of right lower limb: Secondary | ICD-10-CM

## 2014-03-06 DIAGNOSIS — L03119 Cellulitis of unspecified part of limb: Secondary | ICD-10-CM

## 2014-03-06 DIAGNOSIS — L02419 Cutaneous abscess of limb, unspecified: Secondary | ICD-10-CM

## 2014-03-06 MED ORDER — CIPROFLOXACIN HCL 500 MG PO TABS: 500.0000 mg | ORAL_TABLET | Freq: Two times a day (BID) | ORAL | Status: DC

## 2014-03-06 NOTE — Patient Instructions (Signed)
Cellulitis Cellulitis is an infection of the skin and the tissue beneath it. The infected area is usually red and tender. Cellulitis occurs most often in the arms and lower legs.  CAUSES  Cellulitis is caused by bacteria that enter the skin through cracks or cuts in the skin. The most common types of bacteria that cause cellulitis are Staphylococcus and Streptococcus. SYMPTOMS   Redness and warmth.  Swelling.  Tenderness or pain.  Fever. DIAGNOSIS  Your caregiver can usually determine what is wrong based on a physical exam. Blood tests may also be done. TREATMENT  Treatment usually involves taking an antibiotic medicine. HOME CARE INSTRUCTIONS   Take your antibiotics as directed. Finish them even if you start to feel better.  Keep the infected arm or leg elevated to reduce swelling.  Apply a warm cloth to the affected area up to 4 times per day to relieve pain.  Only take over-the-counter or prescription medicines for pain, discomfort, or fever as directed by your caregiver.  Keep all follow-up appointments as directed by your caregiver. SEEK MEDICAL CARE IF:   You notice red streaks coming from the infected area.  Your red area gets larger or turns dark in color.  Your bone or joint underneath the infected area becomes painful after the skin has healed.  Your infection returns in the same area or another area.  You notice a swollen bump in the infected area.  You develop new symptoms. SEEK IMMEDIATE MEDICAL CARE IF:   You have a fever.  You feel very sleepy.  You develop vomiting or diarrhea.  You have a general ill feeling (malaise) with muscle aches and pains. MAKE SURE YOU:   Understand these instructions.  Will watch your condition.  Will get help right away if you are not doing well or get worse. Document Released: 05/29/2005 Document Revised: 02/18/2012 Document Reviewed: 11/04/2011 ExitCare Patient Information 2015 [employer]. This information is  not intended to replace advice given to you by your health care provider. Make sure you discuss any questions you have with your health care provider.  

## 2014-03-06 NOTE — Progress Notes (Signed)
   Subjective:    Patient ID: Nicholas Rodriguez, male    DOB: November 07, 1960, 53 y.o.   MRN: [id number]  HPI 53 year old male pt here for rash on his right ankle. He noticed the rash starting Friday morning. He had MRSA about a month ago. His last culture showed his infection was resistant to Doxycycline. Ciprofloxacin worked well for his last infection. Pt states he has been stressed lately due to marriage and work. He says when he gets stressed he will get these infections.    Review of Systems     Objective:   Physical Exam There is an open sore on the back of the ankle. There is approximately a 3 x 3" of redness surrounding this area this is over the Achilles area the ankle itself is normal        Assessment & Plan:  Patient has history of MRSA. His last culture showed sensitivity to Cipro. Will culture again and resume Cipro for now.

## 2014-03-09 LAB — WOUND CULTURE
GRAM STAIN: NONE SEEN
GRAM STAIN: NONE SEEN
Gram Stain: NONE SEEN

## 2014-03-11 ENCOUNTER — Ambulatory Visit (INDEPENDENT_AMBULATORY_CARE_PROVIDER_SITE_OTHER): Payer: BC Managed Care – PPO | Admitting: Emergency Medicine

## 2014-03-11 VITALS — BP 114/70 | HR 60 | Temp 98.5°F | Resp 16 | Ht 70.0 in | Wt 177.2 lb

## 2014-03-11 DIAGNOSIS — L03115 Cellulitis of right lower limb: Secondary | ICD-10-CM

## 2014-03-11 DIAGNOSIS — L02419 Cutaneous abscess of limb, unspecified: Secondary | ICD-10-CM

## 2014-03-11 DIAGNOSIS — L0291 Cutaneous abscess, unspecified: Secondary | ICD-10-CM

## 2014-03-11 DIAGNOSIS — L03119 Cellulitis of unspecified part of limb: Secondary | ICD-10-CM

## 2014-03-11 DIAGNOSIS — B9562 Methicillin resistant Staphylococcus aureus infection as the cause of diseases classified elsewhere: Secondary | ICD-10-CM | POA: Insufficient documentation

## 2014-03-11 DIAGNOSIS — A4902 Methicillin resistant Staphylococcus aureus infection, unspecified site: Secondary | ICD-10-CM

## 2014-03-11 DIAGNOSIS — L0390 Cellulitis, unspecified: Secondary | ICD-10-CM

## 2014-03-11 NOTE — Patient Instructions (Signed)
[place], [place] [address]   Head north on [address] toward [address]  [address]   Turn right onto [address]  0.9 mi   Slight left onto [address]  1.5 mi   Turn right onto [address]  1.3 [address]   Turn right onto [address]  [address]   [employer] [address] [place], [place], [place] [address]

## 2014-03-11 NOTE — Progress Notes (Addendum)
   Subjective:    Patient ID: Nicholas Rodriguez, male    DOB: [birth year], 53 y.o.   MRN: [id number] This chart was scribed for Nicholas Queen, MD by Vernell Barrier, Medical Scribe. The patient was seen in room 13. This patient's care was started at 2:46 PM.  HPI HPI Comments: Nicholas Rodriguez is a 53 y.o. male w/ hx of cellulitis presents to the [facility] complaining of ongoing cellulitis to the right lateral ankle and over the achilles w/ redness and swelling; onset 7 days ago. Hot to touch. Subjective fever. Decreased ROM of the right ankle. States it does look as if it is improving but the swelling is what concerned him. Been attempting to use hot compresses but states the past 2 days have been so painful and hot to touch he has been unable to apply the heat compress. Seen 5 days prior by Dr. Everlene Farrier and started on Cipro. Been compliant with medications. Has since been discovered that culture is growing MRSA bacteria and resistant to his current Cipro prescription. Patient states he is still working and on his feet approximately 5 days a week. Reports 12 instances of cellulitis in the last 3 years. Denies chills.  Review of Systems  Constitutional: Negative for fever and chills.  Skin: Positive for wound.   Objective:  Physical Exam CONSTITUTIONAL: Well developed/well nourished HEAD: Normocephalic/atraumatic EYES: EOMI/PERRL ENMT: Mucous membranes moist NECK: supple no meningeal signs SPINE:entire spine nontender CV: S1/S2 noted, no murmurs/rubs/gallops noted LUNGS: Lungs are clear to auscultation bilaterally, no apparent distress ABDOMEN: soft, nontender, no rebound or guarding GU:no cva tenderness NEURO: Pt is awake/alert, moves all extremitiesx4 EXTREMITIES: There is an area of tenderness just above the lateral malleolus. There is approximately 2 x 6" of indurated tissue with surrounding redness and a central 2 mm crusted pustule. There is no true fluctuance  SKIN:  warm, color normal PSYCH: no abnormalities of mood noted  Assessment & Plan:  I called [facility] they're going to work the patient in silicate ultrasounds leg and be sure there is not a collection of purulent material if this is clear we'll continue Cipro and followup with Dr. Linna Darner next week.  I personally performed the services described in this documentation, which was scribed in my presence. The recorded information has been reviewed and is accurate.

## 2014-03-11 NOTE — Progress Notes (Signed)
53 yr old male is here today to have his right ankle rechecked. DOS: 03/06/14 Dx Cellulitis. Pt states pain is worse PS 5. Pt states he is having more swelling. Pt states he is still currently taking Prozac 20 mg. Re added to medication list.

## 2014-03-14 ENCOUNTER — Encounter (HOSPITAL_BASED_OUTPATIENT_CLINIC_OR_DEPARTMENT_OTHER): Payer: BC Managed Care – PPO | Admitting: Anesthesiology

## 2014-03-14 ENCOUNTER — Other Ambulatory Visit: Payer: Self-pay | Admitting: Orthopedic Surgery

## 2014-03-14 ENCOUNTER — Ambulatory Visit (HOSPITAL_BASED_OUTPATIENT_CLINIC_OR_DEPARTMENT_OTHER): Payer: BC Managed Care – PPO | Admitting: Anesthesiology

## 2014-03-14 ENCOUNTER — Encounter (HOSPITAL_BASED_OUTPATIENT_CLINIC_OR_DEPARTMENT_OTHER): Payer: Self-pay | Admitting: *Deleted

## 2014-03-14 ENCOUNTER — Encounter (HOSPITAL_BASED_OUTPATIENT_CLINIC_OR_DEPARTMENT_OTHER)
Admission: RE | Disposition: A | Discharge status: Home / Self Care | Payer: Self-pay | Source: Ambulatory Visit | Attending: Orthopedic Surgery

## 2014-03-14 ENCOUNTER — Ambulatory Visit (HOSPITAL_BASED_OUTPATIENT_CLINIC_OR_DEPARTMENT_OTHER)
Admission: RE | Admit: 2014-03-14 | Discharge: 2014-03-14 | Disposition: A | Discharge status: Home / Self Care | Payer: BC Managed Care – PPO | Source: Ambulatory Visit | Attending: Orthopedic Surgery | Admitting: Orthopedic Surgery

## 2014-03-14 DIAGNOSIS — L02419 Cutaneous abscess of limb, unspecified: Secondary | ICD-10-CM | POA: Insufficient documentation

## 2014-03-14 DIAGNOSIS — A4902 Methicillin resistant Staphylococcus aureus infection, unspecified site: Secondary | ICD-10-CM | POA: Insufficient documentation

## 2014-03-14 DIAGNOSIS — L03119 Cellulitis of unspecified part of limb: Principal | ICD-10-CM

## 2014-03-14 HISTORY — PX: INCISION AND DRAINAGE ABSCESS: SHX5864

## 2014-03-14 HISTORY — DX: Depression, unspecified: F32.A

## 2014-03-14 HISTORY — DX: Major depressive disorder, single episode, unspecified: F32.9

## 2014-03-14 LAB — POCT HEMOGLOBIN-HEMACUE: Hemoglobin: 14.9 g/dL (ref 13.0–17.0)

## 2014-03-14 SURGERY — INCISION AND DRAINAGE, ABSCESS
Anesthesia: General | Laterality: Right

## 2014-03-14 MED ORDER — OXYCODONE HCL 5 MG PO TABS
5.0000 mg | ORAL_TABLET | Freq: Once | ORAL | Status: AC | PRN
  Administered 2014-03-14: 5 mg via ORAL

## 2014-03-14 MED ORDER — SUCCINYLCHOLINE CHLORIDE 20 MG/ML IJ SOLN
INTRAMUSCULAR | Status: DC | PRN
  Administered 2014-03-14: 100 mg via INTRAVENOUS

## 2014-03-14 MED ORDER — HYDROMORPHONE HCL PF 1 MG/ML IJ SOLN
INTRAMUSCULAR | Status: AC
  Filled 2014-03-14: qty 1

## 2014-03-14 MED ORDER — VANCOMYCIN HCL IN DEXTROSE 1-5 GM/200ML-% IV SOLN
INTRAVENOUS | Status: AC
  Filled 2014-03-14: qty 200

## 2014-03-14 MED ORDER — MIDAZOLAM HCL 2 MG/2ML IJ SOLN
INTRAMUSCULAR | Status: AC
  Filled 2014-03-14: qty 2

## 2014-03-14 MED ORDER — OXYCODONE HCL 5 MG/5ML PO SOLN: 5.0000 mg | Freq: Once | ORAL | Status: AC | PRN

## 2014-03-14 MED ORDER — BUPIVACAINE HCL (PF) 0.5 % IJ SOLN
INTRAMUSCULAR | Status: AC
  Filled 2014-03-14: qty 30

## 2014-03-14 MED ORDER — VANCOMYCIN HCL IN DEXTROSE 1-5 GM/200ML-% IV SOLN
1000.0000 mg | INTRAVENOUS | Status: AC
  Administered 2014-03-14: 1000 mg via INTRAVENOUS

## 2014-03-14 MED ORDER — PROPOFOL 10 MG/ML IV BOLUS
INTRAVENOUS | Status: DC | PRN
  Administered 2014-03-14: 200 mg via INTRAVENOUS

## 2014-03-14 MED ORDER — LIDOCAINE HCL (CARDIAC) 20 MG/ML IV SOLN
INTRAVENOUS | Status: DC | PRN
  Administered 2014-03-14: 60 mg via INTRAVENOUS

## 2014-03-14 MED ORDER — MIDAZOLAM HCL 2 MG/2ML IJ SOLN: 1.0000 mg | INTRAMUSCULAR | Status: DC | PRN

## 2014-03-14 MED ORDER — FENTANYL CITRATE 0.05 MG/ML IJ SOLN
INTRAMUSCULAR | Status: AC
  Filled 2014-03-14: qty 6

## 2014-03-14 MED ORDER — FENTANYL CITRATE 0.05 MG/ML IJ SOLN
INTRAMUSCULAR | Status: DC | PRN
  Administered 2014-03-14: 100 ug via INTRAVENOUS

## 2014-03-14 MED ORDER — HYDROMORPHONE HCL PF 1 MG/ML IJ SOLN
0.2500 mg | INTRAMUSCULAR | Status: AC | PRN
  Administered 2014-03-14 (×8): 0.5 mg via INTRAVENOUS

## 2014-03-14 MED ORDER — ONDANSETRON HCL 4 MG/2ML IJ SOLN
INTRAMUSCULAR | Status: DC | PRN
  Administered 2014-03-14: 4 mg via INTRAVENOUS

## 2014-03-14 MED ORDER — 0.9 % SODIUM CHLORIDE (POUR BTL) OPTIME
TOPICAL | Status: DC | PRN
  Administered 2014-03-14: 800 mL

## 2014-03-14 MED ORDER — DEXAMETHASONE SODIUM PHOSPHATE 4 MG/ML IJ SOLN
INTRAMUSCULAR | Status: DC | PRN
  Administered 2014-03-14: 4 mg via INTRAVENOUS

## 2014-03-14 MED ORDER — MIDAZOLAM HCL 5 MG/5ML IJ SOLN
INTRAMUSCULAR | Status: DC | PRN
  Administered 2014-03-14: 2 mg via INTRAVENOUS

## 2014-03-14 MED ORDER — OXYCODONE HCL 5 MG PO TABS
ORAL_TABLET | ORAL | Status: AC
  Filled 2014-03-14: qty 1

## 2014-03-14 MED ORDER — LACTATED RINGERS IV SOLN
INTRAVENOUS | Status: DC
  Administered 2014-03-14 (×2): via INTRAVENOUS

## 2014-03-14 MED ORDER — ONDANSETRON HCL 4 MG/2ML IJ SOLN: 4.0000 mg | Freq: Once | INTRAMUSCULAR | Status: DC | PRN

## 2014-03-14 MED ORDER — FENTANYL CITRATE 0.05 MG/ML IJ SOLN: 50.0000 ug | INTRAMUSCULAR | Status: DC | PRN

## 2014-03-14 SURGICAL SUPPLY — 74 items
BANDAGE ELASTIC 4 VELCRO ST LF (GAUZE/BANDAGES/DRESSINGS) IMPLANT
BANDAGE ELASTIC 6 VELCRO ST LF (GAUZE/BANDAGES/DRESSINGS) ×3 IMPLANT
BANDAGE ESMARK 6X9 LF (GAUZE/BANDAGES/DRESSINGS) ×1 IMPLANT
BENZOIN TINCTURE PRP APPL 2/3 (GAUZE/BANDAGES/DRESSINGS) IMPLANT
BLADE SURG 15 STRL LF DISP TIS (BLADE) ×2 IMPLANT
BLADE SURG 15 STRL SS (BLADE) ×4
BNDG COHESIVE 4X5 TAN STRL (GAUZE/BANDAGES/DRESSINGS) ×3 IMPLANT
BNDG ESMARK 4X9 LF (GAUZE/BANDAGES/DRESSINGS) IMPLANT
BNDG ESMARK 6X9 LF (GAUZE/BANDAGES/DRESSINGS) ×3
BNDG GAUZE ELAST 4 BULKY (GAUZE/BANDAGES/DRESSINGS) ×3 IMPLANT
CANISTER SUCT 1200ML W/VALVE (MISCELLANEOUS) ×3 IMPLANT
CHLORAPREP W/TINT 26ML (MISCELLANEOUS) ×6 IMPLANT
CLOSURE WOUND 1/2 X4 (GAUZE/BANDAGES/DRESSINGS)
COVER TABLE BACK 60X90 (DRAPES) ×3 IMPLANT
CUFF TOURNIQUET SINGLE 34IN LL (TOURNIQUET CUFF) IMPLANT
DECANTER SPIKE VIAL GLASS SM (MISCELLANEOUS) IMPLANT
DRAIN PENROSE 1/2X12 LTX STRL (WOUND CARE) ×3 IMPLANT
DRAPE EXTREMITY T 121X128X90 (DRAPE) ×3 IMPLANT
DRAPE U 20/CS (DRAPES) ×3 IMPLANT
DRAPE U-SHAPE 47X51 STRL (DRAPES) ×3 IMPLANT
DRSG EMULSION OIL 3X3 NADH (GAUZE/BANDAGES/DRESSINGS) ×3 IMPLANT
DRSG PAD ABDOMINAL 8X10 ST (GAUZE/BANDAGES/DRESSINGS) ×6 IMPLANT
ELECT REM PT RETURN 9FT ADLT (ELECTROSURGICAL) ×3
ELECTRODE REM PT RTRN 9FT ADLT (ELECTROSURGICAL) ×1 IMPLANT
GAUZE SPONGE 4X4 12PLY STRL (GAUZE/BANDAGES/DRESSINGS) ×3 IMPLANT
GAUZE SPONGE 4X4 16PLY XRAY LF (GAUZE/BANDAGES/DRESSINGS) IMPLANT
GAUZE XEROFORM 1X8 LF (GAUZE/BANDAGES/DRESSINGS) IMPLANT
GLOVE BIO SURGEON STRL SZ7 (GLOVE) ×3 IMPLANT
GLOVE BIO SURGEON STRL SZ7.5 (GLOVE) ×9 IMPLANT
GLOVE BIOGEL PI IND STRL 7.0 (GLOVE) ×1 IMPLANT
GLOVE BIOGEL PI IND STRL 8 (GLOVE) ×1 IMPLANT
GLOVE BIOGEL PI INDICATOR 7.0 (GLOVE) ×2
GLOVE BIOGEL PI INDICATOR 8 (GLOVE) ×2
GLOVE SURG SS PI 7.0 STRL IVOR (GLOVE) ×3 IMPLANT
GLOVE SURG SS PI 7.5 STRL IVOR (GLOVE) ×6 IMPLANT
GOWN STRL REUS W/ TWL LRG LVL3 (GOWN DISPOSABLE) ×1 IMPLANT
GOWN STRL REUS W/ TWL XL LVL3 (GOWN DISPOSABLE) ×2 IMPLANT
GOWN STRL REUS W/TWL LRG LVL3 (GOWN DISPOSABLE) ×2
GOWN STRL REUS W/TWL XL LVL3 (GOWN DISPOSABLE) ×4
HANDPIECE INTERPULSE COAX TIP (DISPOSABLE) ×2
NEEDLE HYPO 22GX1.5 SAFETY (NEEDLE) IMPLANT
NS IRRIG 1000ML POUR BTL (IV SOLUTION) ×3 IMPLANT
PACK BASIN DAY SURGERY FS (CUSTOM PROCEDURE TRAY) ×3 IMPLANT
PAD CAST 4YDX4 CTTN HI CHSV (CAST SUPPLIES) ×1 IMPLANT
PADDING CAST ABS 4INX4YD NS (CAST SUPPLIES) ×2
PADDING CAST ABS COTTON 4X4 ST (CAST SUPPLIES) ×1 IMPLANT
PADDING CAST COTTON 4X4 STRL (CAST SUPPLIES) ×2
PADDING CAST COTTON 6X4 STRL (CAST SUPPLIES) IMPLANT
PENCIL BUTTON HOLSTER BLD 10FT (ELECTRODE) ×3 IMPLANT
SET HNDPC FAN SPRY TIP SCT (DISPOSABLE) ×1 IMPLANT
SPLINT FAST PLASTER 5X30 (CAST SUPPLIES)
SPLINT PLASTER CAST FAST 5X30 (CAST SUPPLIES) IMPLANT
SPONGE LAP 4X18 X RAY DECT (DISPOSABLE) IMPLANT
STAPLER VISISTAT (STAPLE) IMPLANT
STOCKINETTE 6  STRL (DRAPES) ×2
STOCKINETTE 6 STRL (DRAPES) ×1 IMPLANT
STRIP CLOSURE SKIN 1/2X4 (GAUZE/BANDAGES/DRESSINGS) IMPLANT
SUCTION FRAZIER TIP 10 FR DISP (SUCTIONS) IMPLANT
SUT ETHILON 3 0 PS 1 (SUTURE) ×6 IMPLANT
SUT ETHILON 4 0 PS 2 18 (SUTURE) IMPLANT
SUT MON AB 4-0 PC3 18 (SUTURE) IMPLANT
SUT VIC AB 2-0 SH 27 (SUTURE) ×2
SUT VIC AB 2-0 SH 27XBRD (SUTURE) ×1 IMPLANT
SUT VIC AB 3-0 FS2 27 (SUTURE) IMPLANT
SUT VICRYL 4-0 PS2 18IN ABS (SUTURE) IMPLANT
SWAB COLLECTION DEVICE MRSA (MISCELLANEOUS) IMPLANT
SYR BULB 3OZ (MISCELLANEOUS) ×3 IMPLANT
SYRINGE 20CC LL (MISCELLANEOUS) IMPLANT
TOWEL OR NON WOVEN STRL DISP B (DISPOSABLE) ×3 IMPLANT
TUBE ANAEROBIC SPECIMEN COL (MISCELLANEOUS) ×3 IMPLANT
TUBE CONNECTING 20'X1/4 (TUBING) ×1
TUBE CONNECTING 20X1/4 (TUBING) ×2 IMPLANT
UNDERPAD 30X30 INCONTINENT (UNDERPADS AND DIAPERS) ×3 IMPLANT
YANKAUER SUCT BULB TIP NO VENT (SUCTIONS) ×3 IMPLANT

## 2014-03-14 NOTE — Op Note (Signed)
Procedure(s): INCISION AND DRAINAGE ABSCESS Procedure Note  Arush Gatliff male 52 y.o. 03/14/2014  Procedure(s) and Anesthesia Type:    * INCISION AND DRAINAGE ABSCESS Right leg- General  Surgeon(s) and Role:    * Nita Sells, MD - Primary   Indications:  53 y.o. male with Right leg abscess which failed oral antibiotics and a office debris amount. Ultrasound revealed spreading abscess through subcutaneous tissues proximally and distally.     Surgeon: Nita Sells   Assistants: Grier Mitts PA-C  Anesthesia: General endotracheal anesthesia    Procedure Detail  INCISION AND DRAINAGE ABSCESS  Findings:  the abscess was opened sharply and copiously irrigated. New cultures were taken.  Half inch Penrose drains were placed x2  Estimated Blood Loss:  Minimal         Drains: none  Blood Given: none         Specimens: none        Complications:  * No complications entered in OR log *  Tourniquet time: 11 minutes min at 350 mmHg         Disposition: PACU - hemodynamically stable.         Condition: stable    Procedure: The patient was identified in the preoperative holding area where I personally marked the operative site after verifying site side and procedure the patient. He was taken back to the operating room where general anesthesia was induced he was rolled into the prone position with all extremities carefully padded and positioned. A nonsterile tourniquet was applied to the thigh prior to rolling him. After sterile prepping and draping a small open wound was extended proximally and distally to a total incision length about 3 cm. Manual palpation was used to open the subcutaneous layer between the deep fascia and subcutaneous tissues proximally and distally where the pus tracked. It extended about 6 cm proximal and 6 cm distal. Cultures were taken and then copious irrigation was used to irrigate out the abscess cavity. The underlying tissue  appeared fairly healthy. Half inch Penrose drains were then placed proximally and distally the strain measuring about 4 inches long. The wound is then closed loosely over the drains with 3-0 nylon sutures. Sterile dressings were applied and the patient was placed back in his boot.  Postoperative plan: Patient will be discharged home today. He had one dose of IV vancomycin here. He will be discharged on Cipro, which his bacteria was previously shown to be sensitive. He'll followup in 2 days for wound check and to back out the drains part way and then will followup again 2 days later to remove the drains completely.

## 2014-03-14 NOTE — Discharge Instructions (Signed)
Okay to weight bear in boot Keep dressing and ACE on  Elevate frequently  Apply ice to affected area Wiggle toes frequently Take antibiotic medication as prescribed  Please call [phone] during normal business hours or [phone] after hours for any problems. Including the following:  - excessive redness of the incisions - drainage for more than 4 days - fever of more than 101.5 F  *Please note that pain medications will not be refilled after hours or on weekends.   Post Anesthesia Home Care Instructions  Activity: Get plenty of rest for the remainder of the day. A responsible adult should stay with you for 24 hours following the procedure.  For the next 24 hours, DO NOT: -Drive a car -Paediatric nurse -Drink alcoholic beverages -Take any medication unless instructed by your physician -Make any legal decisions or sign important papers.  Meals: Start with liquid foods such as gelatin or soup. Progress to regular foods as tolerated. Avoid greasy, spicy, heavy foods. If nausea and/or vomiting occur, drink only clear liquids until the nausea and/or vomiting subsides. Call your physician if vomiting continues.  Special Instructions/Symptoms: Your throat may feel dry or sore from the anesthesia or the breathing tube placed in your throat during surgery. If this causes discomfort, gargle with warm salt water. The discomfort should disappear within 24 hours.

## 2014-03-14 NOTE — H&P (Signed)
Nicholas Rodriguez is an 53 y.o. male.   Chief Complaint: right leg abscess HPI: one week history of right leg pain and swelling with documented MRSA infection. Failed oral antibiotics and local wound debridement in the office.  No past medical history on file.  No past surgical history on file.  No family history on file. Social History:  has no tobacco, alcohol, and drug history on file.  Allergies: Not on File  No prescriptions prior to admission    No results found for this or any previous visit (from the past 48 hour(s)). No results found.  Review of Systems  All other systems reviewed and are negative.   There were no vitals taken for this visit. Physical Exam  Constitutional: He is oriented to person, place, and time. He appears well-developed and well-nourished.  HENT:  Head: Atraumatic.  Eyes: EOM are normal.  Cardiovascular: Intact distal pulses.   Respiratory: Effort normal.  Musculoskeletal:  Right posterior calf with erythema tenderness and induration is lateral to the distal Achilles musculotendinous junction. Small amount of purulent drainage.  Neurological: He is alert and oriented to person, place, and time.  Skin: Skin is warm and dry.  Psychiatric: He has a normal mood and affect.     Assessment/Plan Right calf abscess Plan open irrigation debridement Risks / benefits of surgery discussed Consent on chart  NPO for OR Preop antibiotics   Leyah Bocchino WILLIAM 03/14/2014, 12:14 PM

## 2014-03-14 NOTE — Anesthesia Postprocedure Evaluation (Signed)
  Anesthesia Post-op Note  Patient: Nicholas Rodriguez  Procedure(s) Performed: Procedure(s): INCISION AND DRAINAGE ABSCESS (Right)  Patient Location: PACU  Anesthesia Type: General   Level of Consciousness: awake, alert  and oriented  Airway and Oxygen Therapy: Patient Spontanous Breathing  Post-op Pain: moderate  Post-op Assessment: Post-op Vital signs reviewed  Post-op Vital Signs: Reviewed  Last Vitals:  Filed Vitals:   03/14/14 1430  BP: 134/82  Pulse: 42  Temp:   Resp: 12    Complications: No apparent anesthesia complications

## 2014-03-14 NOTE — Transfer of Care (Signed)
Immediate Anesthesia Transfer of Care Note  Patient: Nicholas Rodriguez  Procedure(s) Performed: Procedure(s): INCISION AND DRAINAGE ABSCESS (Right)  Patient Location: PACU  Anesthesia Type:General  Level of Consciousness: awake, alert , oriented and patient cooperative  Airway & Oxygen Therapy: Patient Spontanous Breathing and Patient connected to face mask oxygen  Post-op Assessment: Report given to PACU RN and Post -op Vital signs reviewed and stable  Post vital signs: Reviewed and stable  Complications: No apparent anesthesia complications

## 2014-03-14 NOTE — Anesthesia Procedure Notes (Signed)
Procedure Name: Intubation Date/Time: 03/14/2014 1:16 PM Performed by: Kesler Wickham Pre-anesthesia Checklist: Patient identified, Emergency Drugs available, Suction available and Patient being monitored Patient Re-evaluated:Patient Re-evaluated prior to inductionOxygen Delivery Method: Circle System Utilized Preoxygenation: Pre-oxygenation with 100% oxygen Intubation Type: IV induction Ventilation: Mask ventilation without difficulty Laryngoscope Size: Mac and 3 Grade View: Grade II Tube type: Oral Tube size: 7.0 mm Number of attempts: 1 Airway Equipment and Method: stylet Placement Confirmation: ETT inserted through vocal cords under direct vision,  positive ETCO2 and breath sounds checked- equal and bilateral Secured at: 21 cm Tube secured with: Tape Dental Injury: Teeth and Oropharynx as per pre-operative assessment

## 2014-03-14 NOTE — Anesthesia Preprocedure Evaluation (Addendum)
Anesthesia Evaluation  Patient identified by MRN, date of birth, ID band Patient awake    Reviewed: Allergy & Precautions, H&P , NPO status , Patient's Chart, lab work & pertinent test results  Airway Mallampati: I TM Distance: >3 FB Neck ROM: Full    Dental  (+) Teeth Intact, Dental Advisory Given   Pulmonary  breath sounds clear to auscultation        Cardiovascular Rhythm:Regular Rate:Normal     Neuro/Psych    GI/Hepatic   Endo/Other    Renal/GU      Musculoskeletal   Abdominal   Peds  Hematology   Anesthesia Other Findings   Reproductive/Obstetrics                           Anesthesia Physical Anesthesia Plan  ASA: II  Anesthesia Plan: General   Post-op Pain Management:    Induction: Intravenous  Airway Management Planned: Oral ETT  Additional Equipment:   Intra-op Plan:   Post-operative Plan: Extubation in OR  Informed Consent: I have reviewed the patients History and Physical, chart, labs and discussed the procedure including the risks, benefits and alternatives for the proposed anesthesia with the patient or authorized representative who has indicated his/her understanding and acceptance.   Dental advisory given  Plan Discussed with: CRNA, Anesthesiologist and Surgeon  Anesthesia Plan Comments:        Anesthesia Quick Evaluation  

## 2014-03-15 ENCOUNTER — Encounter (HOSPITAL_BASED_OUTPATIENT_CLINIC_OR_DEPARTMENT_OTHER): Payer: Self-pay | Admitting: Orthopedic Surgery

## 2014-03-17 LAB — CULTURE, ROUTINE-ABSCESS

## 2014-03-19 LAB — ANAEROBIC CULTURE

## 2014-04-27 ENCOUNTER — Encounter (HOSPITAL_BASED_OUTPATIENT_CLINIC_OR_DEPARTMENT_OTHER): Payer: BC Managed Care – PPO

## 2014-05-23 ENCOUNTER — Ambulatory Visit (INDEPENDENT_AMBULATORY_CARE_PROVIDER_SITE_OTHER): Payer: BC Managed Care – PPO | Admitting: Emergency Medicine

## 2014-05-23 VITALS — BP 128/80 | HR 64 | Temp 98.8°F | Resp 17 | Ht 70.0 in | Wt 175.0 lb

## 2014-05-23 DIAGNOSIS — M71021 Abscess of bursa, right elbow: Secondary | ICD-10-CM

## 2014-05-23 DIAGNOSIS — M6789 Other specified disorders of synovium and tendon, multiple sites: Secondary | ICD-10-CM

## 2014-05-23 MED ORDER — CIPROFLOXACIN HCL 750 MG PO TABS: 750.0000 mg | ORAL_TABLET | Freq: Two times a day (BID) | ORAL | Status: DC

## 2014-05-23 NOTE — Progress Notes (Signed)
   Subjective:    Patient ID: Nicholas Rodriguez, male    DOB: [birth year], 53 y.o.   MRN: [id number]  HPI  Patient presents today with 4 day history of right elbow swelling. His elbow itches,he had pain that was bad 3 days ago. Now it only hurts if he touches or bumps it. ROM limited by swelling. No numbness or tingling in his extremity. Has a history of tennis elbow and golfer's elbow several years ago- underwent PT with good improvement. Has cervical stenosis.  Patient had some left over Cipro and has taken 4 pills in the last 4 days. He had warmth and redness of the elbow that was worse yesterday. He marked the area with a pen.  Had an injury to this elbow several weeks ago- bumped it on a door. It was sore for a day.   He finished his antibiotics for his right ankle abscess about 3 weeks ago. He saw Dr. Tamera Punt 4 days ago and was released. His swelling of his elbow started later that evening.   Review of Systems No fever, no drainage    Objective:   Physical Exam  Vitals reviewed. Constitutional: He is oriented to person, place, and time. He appears well-developed and well-nourished.  HENT:  Head: Normocephalic and atraumatic.  Eyes: Conjunctivae are normal.  Neck: Normal range of motion. Neck supple.  Cardiovascular: Normal rate.   Pulmonary/Chest: Effort normal.  Musculoskeletal: Normal range of motion. He exhibits edema and tenderness.       Right elbow: He exhibits swelling. Tenderness found. Olecranon process tenderness noted.  Neurological: He is alert and oriented to person, place, and time.  Skin: Skin is warm and dry. There is erythema.  Psychiatric: He has a normal mood and affect. His behavior is normal. Judgment and thought content normal.   Area of redness marked with skin marker     Assessment & Plan:  Discussed with Dr. Everlene Farrier who also examined the patient.  1. Abscess of bursa, right elbow - ciprofloxacin (CIPRO) 750 MG tablet; Take 1 tablet (750 mg total) by  mouth 2 (two) times daily.  Dispense: 20 tablet; Refill: 0 -Provided written and verbal information regarding diagnosis and treatment. -Call placed to Dr. Tamera Punt  -Patient states he is unable to miss more work, he will only be measuring today. He was instructed to elevate/ice area whenever possible -follow up with Dr. Everlene Farrier 9/25. Patient understands importance of returning to clinic if he has increased redness/swelling/pain.  Elby Beck, FNP-BC  [facility]  05/23/2014 9:34 AM

## 2014-05-23 NOTE — Patient Instructions (Signed)
Elevate, ice as needed Follow up on Friday, sooner if worse  Abscess An abscess is an infected area that contains a collection of pus and debris.It can occur in almost any part of the body. An abscess is also known as a furuncle or boil. CAUSES  An abscess occurs when tissue gets infected. This can occur from blockage of oil or sweat glands, infection of hair follicles, or a minor injury to the skin. As the body tries to fight the infection, pus collects in the area and creates pressure under the skin. This pressure causes pain. People with weakened immune systems have difficulty fighting infections and get certain abscesses more often.  SYMPTOMS Usually an abscess develops on the skin and becomes a painful mass that is red, warm, and tender. If the abscess forms under the skin, you may feel a moveable soft area under the skin. Some abscesses break open (rupture) on their own, but most will continue to get worse without care. The infection can spread deeper into the body and eventually into the bloodstream, causing you to feel ill.  DIAGNOSIS  Your caregiver will take your medical history and perform a physical exam. A sample of fluid may also be taken from the abscess to determine what is causing your infection. TREATMENT  Your caregiver may prescribe antibiotic medicines to fight the infection. However, taking antibiotics alone usually does not cure an abscess. Your caregiver may need to make a small cut (incision) in the abscess to drain the pus. In some cases, gauze is packed into the abscess to reduce pain and to continue draining the area. HOME CARE INSTRUCTIONS   Only take over-the-counter or prescription medicines for pain, discomfort, or fever as directed by your caregiver.  If you were prescribed antibiotics, take them as directed. Finish them even if you start to feel better.  If gauze is used, follow your caregiver's directions for changing the gauze.  To avoid spreading the  infection:  Keep your draining abscess covered with a bandage.  Wash your hands well.  Do not share personal care items, towels, or whirlpools with others.  Avoid skin contact with others.  Keep your skin and clothes clean around the abscess.  Keep all follow-up appointments as directed by your caregiver. SEEK MEDICAL CARE IF:   You have increased pain, swelling, redness, fluid drainage, or bleeding.  You have muscle aches, chills, or a general ill feeling.  You have a fever. MAKE SURE YOU:   Understand these instructions.  Will watch your condition.  Will get help right away if you are not doing well or get worse. Document Released: 05/29/2005 Document Revised: 02/18/2012 Document Reviewed: 11/01/2011 [facility] Patient Information 2015 [place], [place]. This information is not intended to replace advice given to you by your health care provider. Make sure you discuss any questions you have with your health care provider.

## 2014-05-27 ENCOUNTER — Ambulatory Visit (INDEPENDENT_AMBULATORY_CARE_PROVIDER_SITE_OTHER): Payer: BC Managed Care – PPO | Admitting: Emergency Medicine

## 2014-05-27 VITALS — BP 102/66 | HR 65 | Temp 98.0°F | Resp 18 | Ht 70.0 in | Wt 180.0 lb

## 2014-05-27 DIAGNOSIS — M6789 Other specified disorders of synovium and tendon, multiple sites: Secondary | ICD-10-CM

## 2014-05-27 DIAGNOSIS — M71021 Abscess of bursa, right elbow: Secondary | ICD-10-CM

## 2014-05-27 NOTE — Progress Notes (Signed)
   Subjective:    Patient ID: Nicholas Rodriguez, male    DOB: 03-13-1961, 53 y.o.   MRN: [id number]  This chart was scribed for [name], MD by Edison Simon, ED Scribe. This patient was seen in room 6 and the patient's care was started at 5:52 PM.   HPI  HPI Comments: Nicholas Rodriguez is a 53 y.o. male who presents to the [facility] for follow up. He was seen here 4 days ago and diagnosed with staph infection to his right elbow. He states his symptoms are much improved at this time. He states he is compliant with his Cipro 750 prescription and has 5 days left. He reports continued itchiness. He denies diarrhea.  Review of Systems  Gastrointestinal: Negative for diarrhea.  Skin: Positive for rash.       itchiness       Objective:   Physical Exam CONSTITUTIONAL: Well developed/well nourished HEAD: Normocephalic/atraumatic EYES: EOMI/PERRL ENMT: Mucous membranes moist NECK: supple no meningeal signs SPINE:entire spine nontender CV: S1/S2 noted, no murmurs/rubs/gallops noted LUNGS: Lungs are clear to auscultation bilaterally, no apparent distress ABDOMEN: soft, nontender, no rebound or guarding GU:no cva tenderness NEURO: Pt is awake/alert, moves all extremitiesx4 EXTREMITIES: pulses normal, full ROM, mild sweling over right olecranon bursa, mild swelling of proximal forearm extensor surface, no redness, no tenderness SKIN: warm, color normal PSYCH: no abnormalities of mood noted     Assessment & Plan:   The area of cellulitis is markedly better. He will finish his Cipro and let us know if he starts to flare. I personally performed the services described in this documentation, which was scribed in my presence. The recorded information has been reviewed and is accurate.

## 2014-06-14 ENCOUNTER — Telehealth: Payer: Self-pay

## 2014-06-14 NOTE — Telephone Encounter (Signed)
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

## 2014-06-14 NOTE — Telephone Encounter (Signed)
LM Pt needs to RTC- Dr. Everlene Farrier is scheduled at the walk in clinic 10/14 8-noon

## 2014-06-15 ENCOUNTER — Ambulatory Visit (INDEPENDENT_AMBULATORY_CARE_PROVIDER_SITE_OTHER): Payer: BC Managed Care – PPO | Admitting: Family Medicine

## 2014-06-15 VITALS — BP 130/86 | HR 82 | Temp 98.2°F | Resp 16 | Ht 69.75 in | Wt 178.6 lb

## 2014-06-15 DIAGNOSIS — L03032 Cellulitis of left toe: Secondary | ICD-10-CM

## 2014-06-15 DIAGNOSIS — Z8614 Personal history of Methicillin resistant Staphylococcus aureus infection: Secondary | ICD-10-CM

## 2014-06-15 DIAGNOSIS — L0291 Cutaneous abscess, unspecified: Secondary | ICD-10-CM

## 2014-06-15 DIAGNOSIS — B353 Tinea pedis: Secondary | ICD-10-CM

## 2014-06-15 DIAGNOSIS — M25572 Pain in left ankle and joints of left foot: Secondary | ICD-10-CM

## 2014-06-15 DIAGNOSIS — L0390 Cellulitis, unspecified: Secondary | ICD-10-CM

## 2014-06-15 MED ORDER — CIPROFLOXACIN HCL 750 MG PO TABS: 750.0000 mg | ORAL_TABLET | Freq: Two times a day (BID) | ORAL | Status: DC

## 2014-06-15 MED ORDER — HYDROCODONE-ACETAMINOPHEN 5-325 MG PO TABS: 1.0000 | ORAL_TABLET | Freq: Four times a day (QID) | ORAL | Status: DC | PRN

## 2014-06-15 NOTE — Patient Instructions (Signed)

## 2014-06-15 NOTE — Progress Notes (Signed)
Chief Complaint:  Chief Complaint  Patient presents with  . Foot Pain    Left x 2 dys MRSA/Infection ?    HPI: Nicholas Rodriguez is a 53 y.o. male who is here for  Cellulitis, he thinks he has  MRSA infection of his left foot. The rash started 2 days ago and last night got worse. He has athletes foot, he has cuts and so may be a trigger.  He has 6/10 pain ,started 2 days ago Monday morning and got worse, no fevers  Or chills. Has used warm compress on it. He has had multiple wound cx that grew out MRSA in the last 2 years. He has issues with cuts and skin infections in the past due to his job, He works with [employer], [employer]. He did not grow out MRSA until 12/2011.   Past Medical History  Diagnosis Date  . MRSA (methicillin resistant Staphylococcus aureus)   . Depression    Past Surgical History  Procedure Laterality Date  . Dental surgery    . Incision and drainage abscess Right 03/14/2014    Procedure: INCISION AND DRAINAGE ABSCESS;  Surgeon: Nita Sells, MD;  Location: [facility];  Service: Orthopedics;  Laterality: Right;   History   Social History  . Marital Status: Married    Spouse Name: N/A    Number of Children: N/A  . Years of Education: N/A   Social History Main Topics  . Smoking status: Never Smoker   . Smokeless tobacco: None  . Alcohol Use: 7.2 oz/week    12 Cans of beer per week     Comment: social  . Drug Use: Yes    Special: Marijuana     Comment: "last night"  . Sexual Activity: Yes     Comment: condom   Other Topics Concern  . None   Social History Narrative   ** Merged History Encounter **       History reviewed. No pertinent family history. Allergies  Allergen Reactions  . Latex Dermatitis    "burns skin"   Prior to Admission medications   Medication Sig Start Date End Date Taking? Authorizing Provider  HYDROcodone-acetaminophen (NORCO/VICODIN) 5-325 MG per tablet Take 1 tablet by mouth every 6 (six)  hours as needed for moderate pain.   Yes Historical Provider, MD     ROS: The patient denies fevers, chills, night sweats, unintentional weight loss, chest pain, palpitations, wheezing, dyspnea on exertion, nausea, vomiting, abdominal pain, dysuria, hematuria, melena, numbness, weakness, or tingling.   All other systems have been reviewed and were otherwise negative with the exception of those mentioned in the HPI and as above.    PHYSICAL EXAM: Filed Vitals:   06/15/14 0835  BP: 130/86  Pulse: 82  Temp: 98.2 F (36.8 C)  Resp: 16   Filed Vitals:   06/15/14 0835  Height: 5' 9.75" (1.772 m)  Weight: 178 lb 9.6 oz (81.012 kg)   Body mass index is 25.8 kg/(m^2).  General: Alert, no acute distress HEENT:  Normocephalic, atraumatic, oropharynx patent. EOMI, PERRLA Cardiovascular:  Regular rate and rhythm, no rubs murmurs or gallops.  No Carotid bruits, radial pulse intact. No pedal edema.  Respiratory: Clear to auscultation bilaterally.  No wheezes, rales, or rhonchi.  No cyanosis, no use of accessory musculature GI: No organomegaly, abdomen is soft and non-tender, positive bowel sounds.  No masses. Skin: + athletes foot, + cellulitis right fore foot, + pus but not fluctauant  enough at base of 2nd toe Neurologic: Facial musculature symmetric. Psychiatric: Patient is appropriate throughout our interaction. Lymphatic: No cervical lymphadenopathy Musculoskeletal: Gait intact.   LABS: Results for orders placed during the hospital encounter of 03/14/14  ANAEROBIC CULTURE      Result Value Ref Range   Specimen Description ABSCESS ANKLE RIGHT     Special Requests PT ON CIPRO     Gram Stain       Value: FEW WBC PRESENT,BOTH PMN AND MONONUCLEAR     NO SQUAMOUS EPITHELIAL CELLS SEEN     RARE GRAM POSITIVE COCCI     IN PAIRS     Performed at [facility]   Culture       Value: NO ANAEROBES ISOLATED     Performed at [facility]   Report Status 03/19/2014 FINAL      CULTURE, ROUTINE-ABSCESS      Result Value Ref Range   Specimen Description ABSCESS ANKLE RIGHT     Special Requests PT ON CIPRO     Gram Stain       Value: FEW WBC PRESENT,BOTH PMN AND MONONUCLEAR     NO SQUAMOUS EPITHELIAL CELLS SEEN     RARE GRAM POSITIVE COCCI     IN PAIRS     Performed at [facility]   Culture       Value: MODERATE METHICILLIN RESISTANT STAPHYLOCOCCUS AUREUS     Note: RIFAMPIN AND GENTAMICIN SHOULD NOT BE USED AS SINGLE DRUGS FOR TREATMENT OF STAPH INFECTIONS.     Performed at [facility]   Report Status 03/17/2014 FINAL     Organism ID, Bacteria METHICILLIN RESISTANT STAPHYLOCOCCUS AUREUS    POCT HEMOGLOBIN-HEMACUE      Result Value Ref Range   Hemoglobin 14.9  13.0 - 17.0 g/dL     EKG/XRAY:   Primary read interpreted by Dr. Marin Comment at [facility].   ASSESSMENT/PLAN: Encounter Diagnoses  Name Primary?  . Cellulitis of toe of left foot Yes  . Athlete's foot on left   . Pain in joint, ankle and foot, left   . Cellulitis and abscess   . 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 Patient is aware that all medications have potential sideeffects and we are unable to predict every sideeffect or drug-drug interaction that may occur.  Paytan Recine, [place], DO 06/15/2014 10:14 AM

## 2014-08-03 ENCOUNTER — Telehealth: Payer: Self-pay

## 2014-08-03 DIAGNOSIS — F320 Major depressive disorder, single episode, mild: Secondary | ICD-10-CM

## 2014-08-03 NOTE — Telephone Encounter (Signed)
Spoke to the pharmacy and his insurance has a deductable to met. Pt is out of medication. He is going to call his insurance company to figure out what has happened. He is going to rtn call as soon as he finds out what needs to be done and if there is something else this office needs to send in for him.

## 2014-08-03 NOTE — Telephone Encounter (Addendum)
Patient has been taking "Fluoxetine" and has been paying between $5-$10 to have it filled. Patient has 2 refills remaing and when he went to have one filled was told the cost is now $87.00. Patient stated he is unable to pay this amount and wants to know if their are any other options for him-coupon/or alternate medication. Per patient he is completely out and not suppose to go without this medication. Patient uses [facility] on [address] and his call back number is [phone]

## 2014-08-04 NOTE — Telephone Encounter (Signed)
Got a req from [facility] for this Rx and LMOM for pt to see if he wanted to order from them. Pt is due for f/up for depression. Find out f/up plan and also ask if pt has checked OOP cost at [employer]/[facility], etc to see if it is on $4 list?

## 2014-08-08 ENCOUNTER — Other Ambulatory Visit: Payer: Self-pay

## 2014-08-08 DIAGNOSIS — F320 Major depressive disorder, single episode, mild: Secondary | ICD-10-CM

## 2014-08-08 MED ORDER — FLUOXETINE HCL 20 MG PO TABS: 20.0000 mg | ORAL_TABLET | Freq: Every day | ORAL | Status: DC

## 2014-08-08 NOTE — Telephone Encounter (Signed)
Took care of this for him- sent to [facility].  Called him and [facility]- please come and see me over the next few months to check in.  Let me know sooner if any problems.

## 2014-08-08 NOTE — Telephone Encounter (Signed)
Was able to reach pt and he reported he tried to return call yesterday and phone was not answered. Apologized he wasn't able to get through. Pt advised that he checked w/ins and was advised that he can get a 90 day RF of prozac from [facility] for $20 and he would like [name] to send in Rx to them. I advised pt that he although he has been in for acute issues, he is overdue for f/up on depression. Pt is at work now, but agreed to call tomorrow and set up appt w/ Dr Lorelei Pont or Dr Marin Comment. Dr Lorelei Pont I'm forwarding to you bc you are the last to see pt for depression. Can we send in the 90 day RF?

## 2014-08-08 NOTE — Telephone Encounter (Signed)
Pt called [name] back, hadn't listened to VM. Gave him Dr Lillie Fragmin message and pt agreed.

## 2014-11-01 ENCOUNTER — Ambulatory Visit (INDEPENDENT_AMBULATORY_CARE_PROVIDER_SITE_OTHER): Payer: BLUE CROSS/BLUE SHIELD | Admitting: Family Medicine

## 2014-11-01 VITALS — BP 116/72 | HR 65 | Temp 97.9°F | Resp 18 | Ht 71.0 in | Wt 183.0 lb

## 2014-11-01 DIAGNOSIS — L0291 Cutaneous abscess, unspecified: Secondary | ICD-10-CM

## 2014-11-01 DIAGNOSIS — M25561 Pain in right knee: Secondary | ICD-10-CM | POA: Diagnosis not present

## 2014-11-01 DIAGNOSIS — L0390 Cellulitis, unspecified: Secondary | ICD-10-CM

## 2014-11-01 MED ORDER — DOXYCYCLINE HYCLATE 100 MG PO TABS
100.0000 mg | ORAL_TABLET | Freq: Two times a day (BID) | ORAL | Status: DC
Start: 2014-11-01 — End: 2015-01-11

## 2014-11-01 NOTE — Progress Notes (Signed)
Chief Complaint:  Chief Complaint  Patient presents with  . Cellulitis    rt knee x1 week     HPI: Nicholas Rodriguez is a 54 y.o. male who is here for right knee abscess with some drainage, x 1 week, no fevers and chills, history of MRSA. It is sore and tender.  He had MRSA which was sensitive to cipro. Last abx was in October, he di dnot have an complications from meds.  He has tried doxycyline in the past and then he got another strain and was sensitive.  He has tried all 3 abx in the past doxy, bactrim adn cipro   Past Medical History  Diagnosis Date  . MRSA (methicillin resistant Staphylococcus aureus)   . Depression    Past Surgical History  Procedure Laterality Date  . Dental surgery    . Incision and drainage abscess Right 03/14/2014    Procedure: INCISION AND DRAINAGE ABSCESS;  Surgeon: Nita Sells, MD;  Location: [place];  Service: Orthopedics;  Laterality: Right;   History   Social History  . Marital Status: Married    Spouse Name: N/A  . Number of Children: N/A  . Years of Education: N/A   Social History Main Topics  . Smoking status: Never Smoker   . Smokeless tobacco: Not on file  . Alcohol Use: 7.2 oz/week    12 Cans of beer per week     Comment: social  . Drug Use: Yes    Special: Marijuana     Comment: "last night"  . Sexual Activity: Yes     Comment: condom   Other Topics Concern  . None   Social History Narrative   ** Merged History Encounter **       History reviewed. No pertinent family history. Allergies  Allergen Reactions  . Latex Dermatitis    "burns skin"   Prior to Admission medications   Medication Sig Start Date End Date Taking? Authorizing Provider  FLUoxetine (PROZAC) 20 MG tablet Take 1 tablet (20 mg total) by mouth daily. 08/08/14  Yes Gay Filler Copland, MD  ciprofloxacin (CIPRO) 750 MG tablet Take 1 tablet (750 mg total) by mouth 2 (two) times daily. Patient not taking: Reported on  11/01/2014 06/15/14   Thao P Le, DO  HYDROcodone-acetaminophen (NORCO/VICODIN) 5-325 MG per tablet Take 1 tablet by mouth every 6 (six) hours as needed for moderate pain. Patient not taking: Reported on 11/01/2014 06/15/14   Thao P Le, DO     ROS: The patient denies fevers, chills, night sweats, unintentional weight loss, chest pain, palpitations, wheezing, dyspnea on exertion, nausea, vomiting, abdominal pain, dysuria, hematuria, melena, numbness, weakness, or tingling.   All other systems have been reviewed and were otherwise negative with the exception of those mentioned in the HPI and as above.    PHYSICAL EXAM: Filed Vitals:   11/01/14 0825  BP: 116/72  Pulse: 65  Temp: 97.9 F (36.6 C)  Resp: 18   Filed Vitals:   11/01/14 0825  Height: 5\' 11"  (1.803 m)  Weight: 183 lb (83.008 kg)   Body mass index is 25.53 kg/(m^2).  General: Alert, no acute distress HEENT:  Normocephalic, atraumatic, oropharynx patent. EOMI, PERRLA Cardiovascular:  Regular rate and rhythm, no rubs murmurs or gallops.  No Carotid bruits, radial pulse intact. No pedal edema.  Respiratory: Clear to auscultation bilaterally.  No wheezes, rales, or rhonchi.  No cyanosis, no use of accessory musculature GI: No  organomegaly, abdomen is soft and non-tender, positive bowel sounds.  No masses. Skin: + erythematous rash , + blister, nonfluctuant, + erythema, 1-1.5 inch , minimal pustular dc, tender Neurologic: Facial musculature symmetric. Psychiatric: Patient is appropriate throughout our interaction. Lymphatic: No cervical lymphadenopathy Musculoskeletal: Gait intact. Full ROM of knee  LABS: Results for orders placed or performed during the hospital encounter of 03/14/14  Anaerobic culture  Result Value Ref Range   Specimen Description ABSCESS ANKLE RIGHT    Special Requests PT ON CIPRO    Gram Stain      FEW WBC PRESENT,BOTH PMN AND MONONUCLEAR NO SQUAMOUS EPITHELIAL CELLS SEEN RARE GRAM POSITIVE COCCI IN  PAIRS Performed at [facility]   Culture      NO ANAEROBES ISOLATED Performed at [facility]   Report Status 03/19/2014 FINAL   Culture, routine-abscess  Result Value Ref Range   Specimen Description ABSCESS ANKLE RIGHT    Special Requests PT ON CIPRO    Gram Stain      FEW WBC PRESENT,BOTH PMN AND MONONUCLEAR NO SQUAMOUS EPITHELIAL CELLS SEEN RARE GRAM POSITIVE COCCI IN PAIRS Performed at [facility]   Culture      MODERATE METHICILLIN RESISTANT STAPHYLOCOCCUS AUREUS Note: RIFAMPIN AND GENTAMICIN SHOULD NOT BE USED AS SINGLE DRUGS FOR TREATMENT OF STAPH INFECTIONS. Performed at [facility]   Report Status 03/17/2014 FINAL    Organism ID, Bacteria METHICILLIN RESISTANT STAPHYLOCOCCUS AUREUS       Susceptibility   Methicillin resistant staphylococcus aureus - MIC*    CLINDAMYCIN <=0.25 SENSITIVE Sensitive     ERYTHROMYCIN <=0.25 SENSITIVE Sensitive     GENTAMICIN <=0.5 SENSITIVE Sensitive     LEVOFLOXACIN 1 SENSITIVE Sensitive     OXACILLIN >=4 RESISTANT Resistant     PENICILLIN >=0.5 RESISTANT Resistant     RIFAMPIN <=0.5 SENSITIVE Sensitive     TRIMETH/SULFA <=10 SENSITIVE Sensitive     VANCOMYCIN 1 SENSITIVE Sensitive     TETRACYCLINE >=16 RESISTANT Resistant     * MODERATE METHICILLIN RESISTANT STAPHYLOCOCCUS AUREUS  Hemoglobin-hemacue, POC  Result Value Ref Range   Hemoglobin 14.9 13.0 - 17.0 g/dL     EKG/XRAY:   Primary read interpreted by Dr. Marin Comment at [facility].   ASSESSMENT/PLAN: Encounter Diagnoses  Name Primary?  . Cellulitis, unspecified cellulitis site, unspecified extremity site, unspecified laterality Yes  . Abscess and cellulitis   . Knee pain, acute, right    Nonfluctuant Rx doxycycline Wound care as directed, precautions as directed, he ahs ahd a hx of MRSA effective with cipro but will try doxyccyline since he is on his knees all the time and I worry about tendon rupture Fu prn   Gross sideeffects, risk and  benefits, and alternatives of medications d/w patient. Patient is aware that all medications have potential sideeffects and we are unable to predict every sideeffect or drug-drug interaction that may occur.  LE, [place], DO 11/01/2014 9:10 AM

## 2015-01-11 ENCOUNTER — Ambulatory Visit (INDEPENDENT_AMBULATORY_CARE_PROVIDER_SITE_OTHER): Payer: BLUE CROSS/BLUE SHIELD | Admitting: Family Medicine

## 2015-01-11 VITALS — BP 112/68 | HR 64 | Temp 97.9°F | Resp 14 | Ht 70.5 in | Wt 183.0 lb

## 2015-01-11 DIAGNOSIS — L0390 Cellulitis, unspecified: Secondary | ICD-10-CM

## 2015-01-11 DIAGNOSIS — B9562 Methicillin resistant Staphylococcus aureus infection as the cause of diseases classified elsewhere: Secondary | ICD-10-CM

## 2015-01-11 DIAGNOSIS — Z8614 Personal history of Methicillin resistant Staphylococcus aureus infection: Secondary | ICD-10-CM

## 2015-01-11 DIAGNOSIS — L0291 Cutaneous abscess, unspecified: Secondary | ICD-10-CM | POA: Diagnosis not present

## 2015-01-11 MED ORDER — DOXYCYCLINE HYCLATE 100 MG PO TABS: 100.0000 mg | ORAL_TABLET | Freq: Two times a day (BID) | ORAL | Status: DC

## 2015-01-11 NOTE — Patient Instructions (Signed)
Chief Complaint:  Chief Complaint  Patient presents with  . Rash    Rash on Abdominal area. x 3 days. Red. Does not itch    HPI: Nicholas Rodriguez is a 54 y.o. male who is here for  3-4 day   Past Medical History  Diagnosis Date  . MRSA (methicillin resistant Staphylococcus aureus)   . Depression    Past Surgical History  Procedure Laterality Date  . Dental surgery    . Incision and drainage abscess Right 03/14/2014    Procedure: INCISION AND DRAINAGE ABSCESS;  Surgeon: Nita Sells, MD;  Location: [place];  Service: Orthopedics;  Laterality: Right;   History   Social History  . Marital Status: Married    Spouse Name: N/A  . Number of Children: N/A  . Years of Education: N/A   Social History Main Topics  . Smoking status: Never Smoker   . Smokeless tobacco: Not on file  . Alcohol Use: 7.2 oz/week    12 Cans of beer per week     Comment: social  . Drug Use: Yes    Special: Marijuana     Comment: "last night"  . Sexual Activity: Yes     Comment: condom   Other Topics Concern  . None   Social History Narrative   ** Merged History Encounter **       History reviewed. No pertinent family history. Allergies  Allergen Reactions  . Latex Dermatitis    "burns skin"   Prior to Admission medications   Medication Sig Start Date End Date Taking? Authorizing Provider  FLUoxetine (PROZAC) 20 MG tablet Take 1 tablet (20 mg total) by mouth daily. 08/08/14  Yes Gay Filler Copland, MD  doxycycline (VIBRA-TABS) 100 MG tablet Take 1 tablet (100 mg total) by mouth 2 (two) times daily. 01/11/15   Kyarra Vancamp P Monica Codd, DO     ROS: The patient denies fevers, chills, night sweats, unintentional weight loss, chest pain, palpitations, wheezing, dyspnea on exertion, nausea, vomiting, abdominal pain, dysuria, hematuria, melena, numbness, weakness, or tingling.  All other systems have been reviewed and were otherwise negative with the exception of those mentioned  in the HPI and as above.    PHYSICAL EXAM: Filed Vitals:   01/11/15 1532  BP: 112/68  Pulse: 64  Temp: 97.9 F (36.6 C)  Resp: 14   Filed Vitals:   01/11/15 1532  Height: 5' 10.5" (1.791 m)  Weight: 183 lb (83.008 kg)   Body mass index is 25.88 kg/(m^2).  General: Alert, no acute distress HEENT:  Normocephalic, atraumatic, oropharynx patent. EOMI, PERRLA Cardiovascular:  Regular rate and rhythm, no rubs murmurs or gallops.  No Carotid bruits, radial pulse intact. No pedal edema.  Respiratory: Clear to auscultation bilaterally.  No wheezes, rales, or rhonchi.  No cyanosis, no use of accessory musculature GI: No organomegaly, abdomen is soft and non-tender, positive bowel sounds.  No masses. Skin: No rashes. Neurologic: Facial musculature symmetric. Psychiatric: Patient is appropriate throughout our interaction. Lymphatic: No cervical lymphadenopathy Musculoskeletal: Gait intact.   LABS: Results for orders placed or performed during the hospital encounter of 03/14/14  Anaerobic culture  Result Value Ref Range   Specimen Description ABSCESS ANKLE RIGHT    Special Requests PT ON CIPRO    Gram Stain      FEW WBC PRESENT,BOTH PMN AND MONONUCLEAR NO SQUAMOUS EPITHELIAL CELLS SEEN RARE GRAM POSITIVE COCCI IN PAIRS Performed at [facility]  NO ANAEROBES ISOLATED Performed at [facility]   Report Status 03/19/2014 FINAL   Culture, routine-abscess  Result Value Ref Range   Specimen Description ABSCESS ANKLE RIGHT    Special Requests PT ON CIPRO    Gram Stain      FEW WBC PRESENT,BOTH PMN AND MONONUCLEAR NO SQUAMOUS EPITHELIAL CELLS SEEN RARE GRAM POSITIVE COCCI IN PAIRS Performed at [facility]   Culture      MODERATE METHICILLIN RESISTANT STAPHYLOCOCCUS AUREUS Note: RIFAMPIN AND GENTAMICIN SHOULD NOT BE USED AS SINGLE DRUGS FOR TREATMENT OF STAPH INFECTIONS. Performed at [facility]   Report Status 03/17/2014 FINAL      Organism ID, Bacteria METHICILLIN RESISTANT STAPHYLOCOCCUS AUREUS       Susceptibility   Methicillin resistant staphylococcus aureus - MIC*    CLINDAMYCIN <=0.25 SENSITIVE Sensitive     ERYTHROMYCIN <=0.25 SENSITIVE Sensitive     GENTAMICIN <=0.5 SENSITIVE Sensitive     LEVOFLOXACIN 1 SENSITIVE Sensitive     OXACILLIN >=4 RESISTANT Resistant     PENICILLIN >=0.5 RESISTANT Resistant     RIFAMPIN <=0.5 SENSITIVE Sensitive     TRIMETH/SULFA <=10 SENSITIVE Sensitive     VANCOMYCIN 1 SENSITIVE Sensitive     TETRACYCLINE >=16 RESISTANT Resistant     * MODERATE METHICILLIN RESISTANT STAPHYLOCOCCUS AUREUS  Hemoglobin-hemacue, POC  Result Value Ref Range   Hemoglobin 14.9 13.0 - 17.0 g/dL     EKG/XRAY:   Primary read interpreted by Dr. Marin Comment at [facility].   ASSESSMENT/PLAN: Encounter Diagnoses  Name Primary?  Marland Kitchen Abscess and cellulitis Yes  . Cellulitis, unspecified cellulitis site, unspecified extremity site, unspecified laterality   . Knee pain, acute, right      Gross sideeffects, risk and benefits, and alternatives of medications d/w patient. Patient is aware that all medications have potential sideeffects and we are unable to predict every sideeffect or drug-drug interaction that may occur.  Esli Clements, [place], DO 01/11/2015 3:58 PM

## 2015-01-11 NOTE — Progress Notes (Signed)
Chief Complaint:  Chief Complaint  Patient presents with  . Rash    Rash on Abdominal area. x 3 days. Red. Does not itch    HPI: Nicholas Rodriguez is a 54 y.o. male who is here for  day history of a left-sided abdominal rash near his belt line. He has a history of MRSA cellulitis. He denies any fevers or chills. He denies any fevers or chills. He had some lesions on his right knee and thigh but there spontaneously resolved and left some dark spots. He does not have any pain with the rash. He he does not have any itching.  There is been no drainage. He has tried warm compresses on it. He tolerated doxycycline okay the last time he had cellulitis.  Past Medical History  Diagnosis Date  . MRSA (methicillin resistant Staphylococcus aureus)   . Depression    Past Surgical History  Procedure Laterality Date  . Dental surgery    . Incision and drainage abscess Right 03/14/2014    Procedure: INCISION AND DRAINAGE ABSCESS;  Surgeon: Nita Sells, MD;  Location: [place];  Service: Orthopedics;  Laterality: Right;   History   Social History  . Marital Status: Married    Spouse Name: N/A  . Number of Children: N/A  . Years of Education: N/A   Social History Main Topics  . Smoking status: Never Smoker   . Smokeless tobacco: Not on file  . Alcohol Use: 7.2 oz/week    12 Cans of beer per week     Comment: social  . Drug Use: Yes    Special: Marijuana     Comment: "last night"  . Sexual Activity: Yes     Comment: condom   Other Topics Concern  . None   Social History Narrative   ** Merged History Encounter **       History reviewed. No pertinent family history. Allergies  Allergen Reactions  . Latex Dermatitis    "burns skin"   Prior to Admission medications   Medication Sig Start Date End Date Taking? Authorizing Provider  FLUoxetine (PROZAC) 20 MG tablet Take 1 tablet (20 mg total) by mouth daily. 08/08/14  Yes Gay Filler Copland, MD    doxycycline (VIBRA-TABS) 100 MG tablet Take 1 tablet (100 mg total) by mouth 2 (two) times daily. 01/11/15   Thao P Le, DO     ROS: The patient denies fevers, chills, night sweats, unintentional weight loss, chest pain, palpitations, wheezing, dyspnea on exertion, nausea, vomiting, abdominal pain, dysuria, hematuria, melena, numbness, weakness, or tingling.   All other systems have been reviewed and were otherwise negative with the exception of those mentioned in the HPI and as above.    PHYSICAL EXAM: Filed Vitals:   01/11/15 1532  BP: 112/68  Pulse: 64  Temp: 97.9 F (36.6 C)  Resp: 14   Filed Vitals:   01/11/15 1532  Height: 5' 10.5" (1.791 m)  Weight: 183 lb (83.008 kg)   Body mass index is 25.88 kg/(m^2).  General: Alert, no acute distress HEENT:  Normocephalic, atraumatic, oropharynx patent. EOMI, PERRLA Cardiovascular:  Regular rate and rhythm, no rubs murmurs or gallops.  No Carotid bruits, radial pulse intact. No pedal edema.  Respiratory: Clear to auscultation bilaterally.  No wheezes, rales, or rhonchi.  No cyanosis, no use of accessory musculature GI: No organomegaly, abdomen is soft and non-tender, positive bowel sounds.  No masses. Skin: Positive erythematous abscess, about 1/2 inch  in diameter. Nonfluctuant. Minimal warmth. No vesicles, no drainage.  Neurologic: Facial musculature symmetric. Psychiatric: Patient is appropriate throughout our interaction. Lymphatic: No cervical lymphadenopathy Musculoskeletal: Gait intact.   LABS: Results for orders placed or performed during the hospital encounter of 03/14/14  Anaerobic culture  Result Value Ref Range   Specimen Description ABSCESS ANKLE RIGHT    Special Requests PT ON CIPRO    Gram Stain      FEW WBC PRESENT,BOTH PMN AND MONONUCLEAR NO SQUAMOUS EPITHELIAL CELLS SEEN RARE GRAM POSITIVE COCCI IN PAIRS Performed at [facility]   Culture      NO ANAEROBES ISOLATED Performed at [facility]   Report Status 03/19/2014 FINAL   Culture, routine-abscess  Result Value Ref Range   Specimen Description ABSCESS ANKLE RIGHT    Special Requests PT ON CIPRO    Gram Stain      FEW WBC PRESENT,BOTH PMN AND MONONUCLEAR NO SQUAMOUS EPITHELIAL CELLS SEEN RARE GRAM POSITIVE COCCI IN PAIRS Performed at [facility]   Culture      MODERATE METHICILLIN RESISTANT STAPHYLOCOCCUS AUREUS Note: RIFAMPIN AND GENTAMICIN SHOULD NOT BE USED AS SINGLE DRUGS FOR TREATMENT OF STAPH INFECTIONS. Performed at [facility]   Report Status 03/17/2014 FINAL    Organism ID, Bacteria METHICILLIN RESISTANT STAPHYLOCOCCUS AUREUS       Susceptibility   Methicillin resistant staphylococcus aureus - MIC*    CLINDAMYCIN <=0.25 SENSITIVE Sensitive     ERYTHROMYCIN <=0.25 SENSITIVE Sensitive     GENTAMICIN <=0.5 SENSITIVE Sensitive     LEVOFLOXACIN 1 SENSITIVE Sensitive     OXACILLIN >=4 RESISTANT Resistant     PENICILLIN >=0.5 RESISTANT Resistant     RIFAMPIN <=0.5 SENSITIVE Sensitive     TRIMETH/SULFA <=10 SENSITIVE Sensitive     VANCOMYCIN 1 SENSITIVE Sensitive     TETRACYCLINE >=16 RESISTANT Resistant     * MODERATE METHICILLIN RESISTANT STAPHYLOCOCCUS AUREUS  Hemoglobin-hemacue, POC  Result Value Ref Range   Hemoglobin 14.9 13.0 - 17.0 g/dL     EKG/XRAY:   Primary read interpreted by Dr. Marin Comment at [facility].   ASSESSMENT/PLAN: Encounter Diagnoses  Name Primary?  Marland Kitchen Abscess and cellulitis Yes  . Cellulitis, unspecified cellulitis site, unspecified extremity site, unspecified laterality   . History of MRSA infection   . MRSA cellulitis    54 year old white male with a past medical history of MRSA cellulitis to 3 presents with another acute infection of the left abdomen.  It is currently not in a state where we can actually do an incision and drainage. Continue with warm compresses Rx doxycycline Follow-up as needed  Gross sideeffects, risk and benefits, and alternatives of  medications d/w patient. Patient is aware that all medications have potential sideeffects and we are unable to predict every sideeffect or drug-drug interaction that may occur.  LE, [place], DO 01/11/2015 4:03 PM

## 2015-07-31 ENCOUNTER — Encounter: Payer: Self-pay | Admitting: Internal Medicine

## 2015-08-08 ENCOUNTER — Telehealth: Payer: Self-pay

## 2015-08-08 DIAGNOSIS — L0291 Cutaneous abscess, unspecified: Secondary | ICD-10-CM

## 2015-08-08 DIAGNOSIS — L0390 Cellulitis, unspecified: Principal | ICD-10-CM

## 2015-08-08 DIAGNOSIS — B9562 Methicillin resistant Staphylococcus aureus infection as the cause of diseases classified elsewhere: Secondary | ICD-10-CM

## 2015-08-08 NOTE — Telephone Encounter (Signed)
Dr. Marin Comment  I spoke with this Pt. And he wanted to let you know he thinks he has MRSA again. He wanted to know if he could get something called in. I did inform him that it might be good for him to come in and speak with you since you havn't seen him since May and I gave him your schedule for tomorrow since he was unable to come in. He wants to hear from you if you do in fact want him to come in or will you just send in a prescription.

## 2015-08-08 NOTE — Telephone Encounter (Signed)
Pt requesting call back from Dr Truman Hayward re his last visit with her?  Best phone for pt is [phone]

## 2015-08-09 ENCOUNTER — Ambulatory Visit (INDEPENDENT_AMBULATORY_CARE_PROVIDER_SITE_OTHER): Payer: BLUE CROSS/BLUE SHIELD

## 2015-08-09 MED ORDER — DOXYCYCLINE HYCLATE 100 MG PO TABS: 100.0000 mg | ORAL_TABLET | Freq: Two times a day (BID) | ORAL | Status: DC

## 2015-08-09 NOTE — Telephone Encounter (Signed)
Spoke with patient. Rx Doxycycline since worked for him. Hx of MRSA

## 2016-02-17 ENCOUNTER — Ambulatory Visit (INDEPENDENT_AMBULATORY_CARE_PROVIDER_SITE_OTHER): Payer: Managed Care, Other (non HMO) | Admitting: Family Medicine

## 2016-02-17 VITALS — BP 112/74 | HR 63 | Temp 98.2°F | Resp 16 | Ht 70.5 in | Wt 179.0 lb

## 2016-02-17 DIAGNOSIS — L0291 Cutaneous abscess, unspecified: Secondary | ICD-10-CM | POA: Diagnosis not present

## 2016-02-17 DIAGNOSIS — L0390 Cellulitis, unspecified: Secondary | ICD-10-CM | POA: Diagnosis not present

## 2016-02-17 DIAGNOSIS — L02416 Cutaneous abscess of left lower limb: Secondary | ICD-10-CM

## 2016-02-17 MED ORDER — DOXYCYCLINE HYCLATE 100 MG PO TABS: 100.0000 mg | ORAL_TABLET | Freq: Two times a day (BID) | ORAL | Status: DC

## 2016-02-17 MED ORDER — MUPIROCIN 2 % EX OINT: TOPICAL_OINTMENT | CUTANEOUS | Status: DC

## 2016-02-17 NOTE — Progress Notes (Signed)
    Nicholas Rodriguez is a 55 y.o. male who presents to [facility] today for abscess left thigh. Patient notes a 40 history of developing abscess on the anterior left thigh. He is use a warm compress which seems to help. He has been expressing pus. He notes his symptoms are consistent with prior episodes of MRSA abscesses. No fevers chills nausea vomiting or diarrhea.   Past Medical History  Diagnosis Date  . MRSA (methicillin resistant Staphylococcus aureus)   . Depression    Past Surgical History  Procedure Laterality Date  . Dental surgery    . Incision and drainage abscess Right 03/14/2014    Procedure: INCISION AND DRAINAGE ABSCESS;  Surgeon: Nita Sells, MD;  Location: [place];  Service: Orthopedics;  Laterality: Right;   Social History  Substance Use Topics  . Smoking status: Never Smoker   . Smokeless tobacco: Not on file  . Alcohol Use: 7.2 oz/week    12 Cans of beer per week     Comment: social   ROS as above Medications: Current Outpatient Prescriptions  Medication Sig Dispense Refill  . FLUoxetine (PROZAC) 20 MG tablet Take 1 tablet (20 mg total) by mouth daily. 180 tablet 1  . doxycycline (VIBRA-TABS) 100 MG tablet Take 1 tablet (100 mg total) by mouth 2 (two) times daily. 20 tablet 0  . mupirocin ointment (BACTROBAN) 2 % Apply to affected area TID for 7 days. 30 g 3   No current facility-administered medications for this visit.   Allergies  Allergen Reactions  . Latex Dermatitis    "burns skin"     Exam:  BP 112/74 mmHg  Pulse 63  Temp(Src) 98.2 F (36.8 C) (Oral)  Resp 16  Ht 5' 10.5" (1.791 m)  Wt 179 lb (81.194 kg)  BMI 25.31 kg/m2  SpO2 98% Gen: Well NAD Skin left anterior thigh with erythematous area with mild fluctuance. Tender to touch.  Abscess incision and drainage. Consent obtained and timeout performed. Skin cleaned with alcohol, and cold spray applied. 2 mL of lidocaine without epinephrine injected achieving  good anesthesia. Skin was again cleaned with alcohol. A sharp incision was made to the area of fluctuance. The incision was widened and pus was expressed. Pus was cultured. Blunt dissection was used to break up loculations. Further pus was expressed. Patient tolerated the procedure well. A dressing was applied   No results found for this or any previous visit (from the past 24 hour(s)). No results found.  Assessment and Plan: 55 y.o. male with left thigh abscess. Culture pending. Treat with doxycycline and mupirocin ointment  Discussed warning signs or symptoms. Please see discharge instructions. Patient expresses understanding.

## 2016-02-17 NOTE — Patient Instructions (Addendum)
Thank you for coming in today.  Abscess An abscess is an infected area that contains a collection of pus and debris.It can occur in almost any part of the body. An abscess is also known as a furuncle or boil. CAUSES  An abscess occurs when tissue gets infected. This can occur from blockage of oil or sweat glands, infection of hair follicles, or a minor injury to the skin. As the body tries to fight the infection, pus collects in the area and creates pressure under the skin. This pressure causes pain. People with weakened immune systems have difficulty fighting infections and get certain abscesses more often.  SYMPTOMS Usually an abscess develops on the skin and becomes a painful mass that is red, warm, and tender. If the abscess forms under the skin, you may feel a moveable soft area under the skin. Some abscesses break open (rupture) on their own, but most will continue to get worse without care. The infection can spread deeper into the body and eventually into the bloodstream, causing you to feel ill.  DIAGNOSIS  Your caregiver will take your medical history and perform a physical exam. A sample of fluid may also be taken from the abscess to determine what is causing your infection. TREATMENT  Your caregiver may prescribe antibiotic medicines to fight the infection. However, taking antibiotics alone usually does not cure an abscess. Your caregiver may need to make a small cut (incision) in the abscess to drain the pus. In some cases, gauze is packed into the abscess to reduce pain and to continue draining the area. HOME CARE INSTRUCTIONS   Only take over-the-counter or prescription medicines for pain, discomfort, or fever as directed by your caregiver.  If you were prescribed antibiotics, take them as directed. Finish them even if you start to feel better.  If gauze is used, follow your caregiver's directions for changing the gauze.  To avoid spreading the infection:  Keep your draining  abscess covered with a bandage.  Wash your hands well.  Do not share personal care items, towels, or whirlpools with others.  Avoid skin contact with others.  Keep your skin and clothes clean around the abscess.  Keep all follow-up appointments as directed by your caregiver. SEEK MEDICAL CARE IF:   You have increased pain, swelling, redness, fluid drainage, or bleeding.  You have muscle aches, chills, or a general ill feeling.  You have a fever. MAKE SURE YOU:   Understand these instructions.  Will watch your condition.  Will get help right away if you are not doing well or get worse.   This information is not intended to replace advice given to you by your health care provider. Make sure you discuss any questions you have with your health care provider.   Document Released: 05/29/2005 Document Revised: 02/18/2012 Document Reviewed: 11/01/2011 Elsevier Interactive Patient Education 2016 [employer].   IF you received an x-ray today, you will receive an invoice from [facility]. Please contact [facility] at [phone] with questions or concerns regarding your invoice.   IF you received labwork today, you will receive an invoice from [employer]. Please contact [facility] at [phone] with questions or concerns regarding your invoice.   Our billing staff will not be able to assist you with questions regarding bills from these companies.  You will be contacted with the lab results as soon as they are available. The fastest way to get your results is to activate your My Chart account. Instructions  are located on the last page of this paperwork. If you have not heard from [name] regarding the results in 2 weeks, please contact this office.

## 2016-02-17 NOTE — Addendum Note (Signed)
Addended by: Carter Kitten on: 02/17/2016 02:14 PM   Modules accepted: Miquel Dunn

## 2016-02-20 LAB — WOUND CULTURE

## 2016-02-20 NOTE — Progress Notes (Signed)
Quick Note:  Bacteria is MRSA but sensitive to the antibiotics we picked. ______

## 2016-03-11 ENCOUNTER — Encounter (HOSPITAL_COMMUNITY): Payer: Self-pay | Admitting: Emergency Medicine

## 2016-03-11 ENCOUNTER — Ambulatory Visit (HOSPITAL_COMMUNITY)
Admission: EM | Admit: 2016-03-11 | Discharge: 2016-03-11 | Disposition: A | Discharge status: Home / Self Care | Payer: Managed Care, Other (non HMO) | Attending: Emergency Medicine | Admitting: Emergency Medicine

## 2016-03-11 DIAGNOSIS — R0789 Other chest pain: Secondary | ICD-10-CM

## 2016-03-11 DIAGNOSIS — S61012A Laceration without foreign body of left thumb without damage to nail, initial encounter: Secondary | ICD-10-CM

## 2016-03-11 DIAGNOSIS — S61002A Unspecified open wound of left thumb without damage to nail, initial encounter: Secondary | ICD-10-CM

## 2016-03-11 DIAGNOSIS — Z8669 Personal history of other diseases of the nervous system and sense organs: Secondary | ICD-10-CM | POA: Diagnosis not present

## 2016-03-11 DIAGNOSIS — R208 Other disturbances of skin sensation: Secondary | ICD-10-CM

## 2016-03-11 MED ORDER — LIDOCAINE HCL 2 % IJ SOLN
INTRAMUSCULAR | Status: AC
  Filled 2016-03-11: qty 20

## 2016-03-11 NOTE — ED Provider Notes (Signed)
CSN: [id number]     Arrival date & time 03/11/16  1712 History   None    No chief complaint on file.  (Consider location/radiation/quality/duration/timing/severity/associated sxs/prior Treatment) HPI  Past Medical History  Diagnosis Date  . MRSA (methicillin resistant Staphylococcus aureus)   . Depression    Past Surgical History  Procedure Laterality Date  . Dental surgery    . Incision and drainage abscess Right 03/14/2014    Procedure: INCISION AND DRAINAGE ABSCESS;  Surgeon: Nita Sells, MD;  Location: [place];  Service: Orthopedics;  Laterality: Right;   No family history on file. Social History  Substance Use Topics  . Smoking status: Never Smoker   . Smokeless tobacco: Not on file  . Alcohol Use: 7.2 oz/week    12 Cans of beer per week     Comment: social    Review of Systems  Allergies  Latex  Home Medications   Prior to Admission medications   Medication Sig Start Date End Date Taking? Authorizing Provider  doxycycline (VIBRA-TABS) 100 MG tablet Take 1 tablet (100 mg total) by mouth 2 (two) times daily. 02/17/16   Gregor Hams, MD  FLUoxetine (PROZAC) 20 MG tablet Take 1 tablet (20 mg total) by mouth daily. 08/08/14   Darreld Mclean, MD  mupirocin ointment (BACTROBAN) 2 % Apply to affected area TID for 7 days. 02/17/16   Gregor Hams, MD   Meds Ordered and Administered this Visit  Medications - No data to display  BP 111/67 mmHg  Pulse 66  Temp(Src) 98 F (36.7 C) (Oral)  Resp 16  SpO2 98% No data found.   Physical Exam  ED Course  .Marland KitchenLaceration Repair Date/Time: 03/11/2016 7:37 PM Performed by: Lysbeth Penner Authorized by: Melony Overly Consent: Verbal consent obtained. Written consent obtained. Patient understanding: patient states understanding of the procedure being performed Patient consent: the patient's understanding of the procedure matches consent given Procedure consent: procedure consent matches  procedure scheduled Relevant documents: relevant documents present and verified Test results: test results available and properly labeled Site marked: the operative site was marked Imaging studies: imaging studies available Body area: upper extremity Location details: left thumb Laceration length: 2 cm Foreign bodies: metal Tendon involvement: none Nerve involvement: none Vascular damage: no Anesthesia: local infiltration Local anesthetic: lidocaine 1% without epinephrine Anesthetic total: 3 ml Preparation: Patient was prepped and draped in the usual sterile fashion. Irrigation solution: saline Irrigation method: syringe Amount of cleaning: standard Debridement: none Degree of undermining: none Skin closure: 4-0 nylon Number of sutures: 3 Technique: simple Approximation: close Approximation difficulty: simple Dressing: antibiotic ointment Patient tolerance: Patient tolerated the procedure well with no immediate complications   (including critical care time)  Labs Review Labs Reviewed - No data to display  Imaging Review No results found.   Visual Acuity Review  Right Eye Distance:   Left Eye Distance:   Bilateral Distance:    Right Eye Near:   Left Eye Near:    Bilateral Near:         MDM  Left thumb laceration - 3 sutures 4.0  And follow up in 10 days     Lysbeth Penner, FNP 03/11/16 1939

## 2016-03-11 NOTE — Discharge Instructions (Signed)
Stitches, Staples, or Adhesive Wound Closure  °Health care providers use stitches (sutures), staples, and certain glue (skin adhesives) to hold skin together while it heals (wound closure). You may need this treatment after you have surgery or if you cut your skin accidentally. These methods help your skin to heal more quickly and make it less likely that you will have a scar. A wound may take several months to heal completely.  °The type of wound you have determines when your wound gets closed. In most cases, the wound is closed as soon as possible (primary skin closure). Sometimes, closure is delayed so the wound can be cleaned and allowed to heal naturally. This reduces the chance of infection. Delayed closure may be needed if your wound:  °Is caused by a bite.  °Happened more than 6 hours ago.  °Involves loss of skin or the tissues under the skin.  °Has dirt or debris in it that cannot be removed.  °Is infected. °WHAT ARE THE DIFFERENT KINDS OF WOUND CLOSURES?  °There are many options for wound closure. The one that your health care provider uses depends on how deep and how large your wound is.  °Adhesive Glue  °To use this type of glue to close a wound, your health care provider holds the edges of the wound together and paints the glue on the surface of your skin. You may need more than one layer of glue. Then the wound may be covered with a light bandage (dressing).  °This type of skin closure may be used for small wounds that are not deep (superficial). Using glue for wound closure is less painful than other methods. It does not require a medicine that numbs the area (local anesthetic). This method also leaves nothing to be removed. Adhesive glue is often used for children and on facial wounds.  °Adhesive glue cannot be used for wounds that are deep, uneven, or bleeding. It is not used inside of a wound.  °Adhesive Strips  °These strips are made of sticky (adhesive), porous paper. They are applied across your  skin edges like a regular adhesive bandage. You leave them on until they fall off.  °Adhesive strips may be used to close very superficial wounds. They may also be used along with sutures to improve the closure of your skin edges.  °Sutures  °Sutures are the oldest method of wound closure. Sutures can be made from natural substances, such as silk, or from synthetic materials, such as nylon and steel. They can be made from a material that your body can break down as your wound heals (absorbable), or they can be made from a material that needs to be removed from your skin (nonabsorbable). They come in many different strengths and sizes.  °Your health care provider attaches the sutures to a steel needle on one end. Sutures can be passed through your skin, or through the tissues beneath your skin. Then they are tied and cut. Your skin edges may be closed in one continuous stitch or in separate stitches.  °Sutures are strong and can be used for all kinds of wounds. Absorbable sutures may be used to close tissues under the skin. The disadvantage of sutures is that they may cause skin reactions that lead to infection. Nonabsorbable sutures need to be removed.  °Staples  °When surgical staples are used to close a wound, the edges of your skin on both sides of the wound are brought close together. A staple is placed across the wound, and   an instrument secures the edges together. Staples are often used to close surgical cuts (incisions).  °Staples are faster to use than sutures, and they cause less skin reaction. Staples need to be removed using a tool that bends the staples away from your skin.  °HOW DO I CARE FOR MY WOUND CLOSURE?  °Take medicines only as directed by your health care provider.  °If you were prescribed an antibiotic medicine for your wound, finish it all even if you start to feel better.  °Use ointments or creams only as directed by your health care provider.  °Wash your hands with soap and water before and  after touching your wound.  °Do not soak your wound in water. Do not take baths, swim, or use a hot tub until your health care provider approves.  °Ask your health care provider when you can start showering. Cover your wound if directed by your health care provider.  °Do not take out your own sutures or staples.  °Do not pick at your wound. Picking can cause an infection.  °Keep all follow-up visits as directed by your health care provider. This is important. °HOW LONG WILL I HAVE MY WOUND CLOSURE?  °Leave adhesive glue on your skin until the glue peels away.  °Leave adhesive strips on your skin until the strips fall off.  °Absorbable sutures will dissolve within several days.  °Nonabsorbable sutures and staples must be removed. The location of the wound will determine how long they stay in. This can range from several days to a couple of weeks. °WHEN SHOULD I SEEK HELP FOR MY WOUND CLOSURE?  °Contact your health care provider if:  °You have a fever.  °You have chills.  °You have drainage, redness, swelling, or pain at your wound.  °There is a bad smell coming from your wound.  °The skin edges of your wound start to separate after your sutures have been removed.  °Your wound becomes thick, raised, and darker in color after your sutures come out (scarring). °This information is not intended to replace advice given to you by your health care provider. Make sure you discuss any questions you have with your health care provider.  °Document Released: 05/14/2001 Document Revised: 09/09/2014 Document Reviewed: 01/26/2014  °Elsevier Interactive Patient Education ©2016 Elsevier Inc.  ° °

## 2016-03-11 NOTE — ED Notes (Addendum)
The patient presented to the [facility] with a complaint of a laceration to the thumb on his left hand that occurred today with a razor knife. The patient stated that his tdap is up to date.

## 2016-03-20 ENCOUNTER — Ambulatory Visit (INDEPENDENT_AMBULATORY_CARE_PROVIDER_SITE_OTHER): Payer: Managed Care, Other (non HMO)

## 2016-03-20 ENCOUNTER — Ambulatory Visit (INDEPENDENT_AMBULATORY_CARE_PROVIDER_SITE_OTHER): Payer: Managed Care, Other (non HMO) | Admitting: Urgent Care

## 2016-03-20 VITALS — BP 110/82 | HR 56 | Temp 98.3°F | Ht 70.5 in | Wt 179.0 lb

## 2016-03-20 DIAGNOSIS — M79645 Pain in left finger(s): Secondary | ICD-10-CM | POA: Diagnosis not present

## 2016-03-20 DIAGNOSIS — L03012 Cellulitis of left finger: Secondary | ICD-10-CM | POA: Diagnosis not present

## 2016-03-20 DIAGNOSIS — S61012D Laceration without foreign body of left thumb without damage to nail, subsequent encounter: Secondary | ICD-10-CM

## 2016-03-20 DIAGNOSIS — Z8614 Personal history of Methicillin resistant Staphylococcus aureus infection: Secondary | ICD-10-CM

## 2016-03-20 DIAGNOSIS — M7989 Other specified soft tissue disorders: Secondary | ICD-10-CM

## 2016-03-20 LAB — POCT CBC
GRANULOCYTE PERCENT: 70.9 % (ref 37–80)
HCT, POC: 44.5 % (ref 43.5–53.7)
Hemoglobin: 15.8 g/dL (ref 14.1–18.1)
LYMPH, POC: 1.8 (ref 0.6–3.4)
MCH, POC: 30 pg (ref 27–31.2)
MCHC: 35.5 g/dL — AB (ref 31.8–35.4)
MCV: 84.4 fL (ref 80–97)
MID (CBC): 0.8 (ref 0–0.9)
MPV: 7.4 fL (ref 0–99.8)
PLATELET COUNT, POC: 222 10*3/uL (ref 142–424)
POC Granulocyte: 6.3 (ref 2–6.9)
POC LYMPH %: 20.4 % (ref 10–50)
POC MID %: 8.7 %M (ref 0–12)
RBC: 5.27 M/uL (ref 4.69–6.13)
RDW, POC: 13.7 %
WBC: 8.9 10*3/uL (ref 4.6–10.2)

## 2016-03-20 MED ORDER — SULFAMETHOXAZOLE-TRIMETHOPRIM 800-160 MG PO TABS: 1.0000 | ORAL_TABLET | Freq: Two times a day (BID) | ORAL | Status: DC

## 2016-03-20 NOTE — Progress Notes (Signed)
MRN: [id number] DOB: Jan 23, 1961  Subjective:   Nicholas Rodriguez is a 55 y.o. male presenting for chief complaint of Suture / Staple Removal and Laceration  Patient is here for suture removal. He was initially seen at [facility] on 03/11/2016. Has not been using any medications for this. Patient has had persistent pain, rated 5/10, swelling, redness, numbness of top middle joint of his thumb, decreased ROM. Two sutures that were placed came out on their own, states that he had some drainage of pus when they came out. He still has another 2. Denies fever, trauma. Of note, patient suffered his laceration while using a razor blade to cut a piece of wood. He has continued to work and has kept his wound covered. Reports a history of MRSA.  Nicholas Rodriguez currently has no medications in their medication list. Also is allergic to latex.  Nicholas Rodriguez  has a past medical history of MRSA (methicillin resistant Staphylococcus aureus) and Depression. Also  has past surgical history that includes Dental surgery and Incision and drainage abscess (Right, 03/14/2014).  Objective:   Vitals: BP 110/82 mmHg  Pulse 56  Temp(Src) 98.3 F (36.8 C) (Oral)  Ht 5' 10.5" (1.791 m)  Wt 179 lb (81.194 kg)  BMI 25.31 kg/m2  SpO2 98%  Physical Exam  Constitutional: He is oriented to person, place, and time. He appears well-developed and well-nourished.  Cardiovascular: Normal rate.   Pulmonary/Chest: Effort normal.  Musculoskeletal:       Left hand: He exhibits decreased range of motion (flexion), tenderness (over wound), bony tenderness (over dorsal aspect of proximal phalange), laceration (well-approximated) and swelling. He exhibits normal capillary refill and no deformity. Normal sensation noted. Decreased strength (secondary to pain) noted.       Hands: Neurological: He is alert and oriented to person, place, and time.   Dg Finger Thumb Left  03/20/2016  CLINICAL DATA:  Left thumb laceration and cellulitis.  EXAM: LEFT THUMB 2+V COMPARISON:  None. FINDINGS: Moderate spur formation on both sides of the first IP joint. There is a triangular-shaped bone fragment dorsal to the first IP joint. IMPRESSION: Moderate IP joint degenerative changes with fragmented spur formation dorsally. The fragmentation could be chronic or represent an acute fracture through the base of the spur arising from the base of the first distal phalanx. Electronically Signed   By: Claudie Revering M.D.   On: 03/20/2016 18:03    Results for orders placed or performed in visit on 03/20/16 (from the past 24 hour(s))  POCT CBC     Status: Abnormal   Collection Time: 03/20/16  6:01 PM  Result Value Ref Range   WBC 8.9 4.6 - 10.2 K/uL   Lymph, poc 1.8 0.6 - 3.4   POC LYMPH PERCENT 20.4 10 - 50 %L   MID (cbc) 0.8 0 - 0.9   POC MID % 8.7 0 - 12 %M   POC Granulocyte 6.3 2 - 6.9   Granulocyte percent 70.9 37 - 80 %G   RBC 5.27 4.69 - 6.13 M/uL   Hemoglobin 15.8 14.1 - 18.1 g/dL   HCT, POC 44.5 43.5 - 53.7 %   MCV 84.4 80 - 97 fL   MCH, POC 30.0 27 - 31.2 pg   MCHC 35.5 (A) 31.8 - 35.4 g/dL   RDW, POC 13.7 %   Platelet Count, POC 222 142 - 424 K/uL   MPV 7.4 0 - 99.8 fL   WOUND Rodriguez: 2 sutures were removed with  drainage of pus. Wound was expressed further which reopened the laceration superficially. There was slight amount of drainage thereafter. Cleansed and dressed.  Assessment and Plan :   1. Cellulitis of left thumb 2. Thumb laceration, left, subsequent encounter 3. Thumb pain, left 4. Swelling of left thumb 5. 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 I do no suspect that he has an acute fracture given that his laceration was not traumatic. He is in agreement. He is to apply dressing while at work, clean his thumb clean. Leave it open to air while at home, clean and dry. RTC in 2-3 days if no improvement.  Nicholas Eagles, PA-C [facility] [phone] 03/20/2016 5:42 PM

## 2016-03-20 NOTE — Patient Instructions (Addendum)
Cellulitis Cellulitis is an infection of the skin and the tissue beneath it. The infected area is usually red and tender. Cellulitis occurs most often in the arms and lower legs.  CAUSES  Cellulitis is caused by bacteria that enter the skin through cracks or cuts in the skin. The most common types of bacteria that cause cellulitis are staphylococci and streptococci. SIGNS AND SYMPTOMS   Redness and warmth.  Swelling.  Tenderness or pain.  Fever. DIAGNOSIS  Your health care provider can usually determine what is wrong based on a physical exam. Blood tests may also be done. TREATMENT  Treatment usually involves taking an antibiotic medicine. HOME CARE INSTRUCTIONS   Take your antibiotic medicine as directed by your health care provider. Finish the antibiotic even if you start to feel better.  Keep the infected arm or leg elevated to reduce swelling.  Apply a warm cloth to the affected area up to 4 times per day to relieve pain.  Take medicines only as directed by your health care provider.  Keep all follow-up visits as directed by your health care provider. SEEK MEDICAL CARE IF:   You notice red streaks coming from the infected area.  Your red area gets larger or turns dark in color.  Your bone or joint underneath the infected area becomes painful after the skin has healed.  Your infection returns in the same area or another area.  You notice a swollen bump in the infected area.  You develop new symptoms.  You have a fever. SEEK IMMEDIATE MEDICAL CARE IF:   You feel very sleepy.  You develop vomiting or diarrhea.  You have a general ill feeling (malaise) with muscle aches and pains.   This information is not intended to replace advice given to you by your health care provider. Make sure you discuss any questions you have with your health care provider.   Document Released: 05/29/2005 Document Revised: 05/10/2015 Document Reviewed: 11/04/2011 Elsevier Interactive  Patient Education 2016 [employer].   Suture Removal, Care After Refer to this sheet in the next few weeks. These instructions provide you with information on caring for yourself after your procedure. Your health care provider may also give you more specific instructions. Your treatment has been planned according to current medical practices, but problems sometimes occur. Call your health care provider if you have any problems or questions after your procedure. WHAT TO EXPECT AFTER THE PROCEDURE After your stitches (sutures) are removed, it is typical to have the following:  Some discomfort and swelling in the wound area.  Slight redness in the area. HOME CARE INSTRUCTIONS   If you have skin adhesive strips over the wound area, do not take the strips off. They will fall off on their own in a few days. If the strips remain in place after 14 days, you may remove them.  Change any bandages (dressings) at least once a day or as directed by your health care provider. If the bandage sticks, soak it off with warm, soapy water.  Apply cream or ointment only as directed by your health care provider. If using cream or ointment, wash the area with soap and water 2 times a day to remove all the cream or ointment. Rinse off the soap and pat the area dry with a clean towel.  Keep the wound area dry and clean. If the bandage becomes wet or dirty, or if it develops a bad smell, change it as soon as possible.  Continue to protect the  wound from injury.  Use sunscreen when out in the sun. New scars become sunburned easily. SEEK MEDICAL CARE IF:  You have increasing redness, swelling, or pain in the wound.  You see pus coming from the wound.  You have a fever.  You notice a bad smell coming from the wound or dressing.  Your wound breaks open (edges not staying together).   This information is not intended to replace advice given to you by your health care provider. Make sure you discuss any  questions you have with your health care provider.   Document Released: 05/14/2001 Document Revised: 06/09/2013 Document Reviewed: 03/31/2013 Elsevier Interactive Patient Education 2016 [employer].     IF you received an x-ray today, you will receive an invoice from [facility]. Please contact [facility] at [phone] with questions or concerns regarding your invoice.   IF you received labwork today, you will receive an invoice from [employer]. Please contact [facility] at [phone] with questions or concerns regarding your invoice.   Our billing staff will not be able to assist you with questions regarding bills from these companies.  You will be contacted with the lab results as soon as they are available. The fastest way to get your results is to activate your My Chart account. Instructions are located on the last page of this paperwork. If you have not heard from [name] regarding the results in 2 weeks, please contact this office.

## 2016-12-07 ENCOUNTER — Ambulatory Visit (INDEPENDENT_AMBULATORY_CARE_PROVIDER_SITE_OTHER): Payer: Managed Care, Other (non HMO) | Admitting: Family Medicine

## 2016-12-07 ENCOUNTER — Encounter: Payer: Self-pay | Admitting: Family Medicine

## 2016-12-07 VITALS — BP 124/82 | HR 65 | Temp 98.2°F | Resp 18 | Ht 69.5 in | Wt 179.0 lb

## 2016-12-07 DIAGNOSIS — R030 Elevated blood-pressure reading, without diagnosis of hypertension: Secondary | ICD-10-CM

## 2016-12-07 DIAGNOSIS — L02419 Cutaneous abscess of limb, unspecified: Secondary | ICD-10-CM

## 2016-12-07 MED ORDER — DOXYCYCLINE HYCLATE 100 MG PO CAPS: 100.0000 mg | ORAL_CAPSULE | Freq: Two times a day (BID) | ORAL | 0 refills | Status: DC

## 2016-12-07 MED ORDER — MUPIROCIN 2 % EX OINT: 1.0000 "application " | TOPICAL_OINTMENT | Freq: Three times a day (TID) | CUTANEOUS | 2 refills | Status: DC

## 2016-12-07 NOTE — Patient Instructions (Addendum)
IF you received an x-ray today, you will receive an invoice from [facility]. Please contact [facility] at [phone] with questions or concerns regarding your invoice.   IF you received labwork today, you will receive an invoice from [facility]. Please contact [facility] at [phone] with questions or concerns regarding your invoice.   Our billing staff will not be able to assist you with questions regarding bills from these companies.  You will be contacted with the lab results as soon as they are available. The fastest way to get your results is to activate your My Chart account. Instructions are located on the last page of this paperwork. If you have not heard from [name] regarding the results in 2 weeks, please contact this office.    Skin Abscess A skin abscess is an infected area on or under your skin that contains a collection of pus and other material. An abscess may also be called a furuncle, carbuncle, or boil. An abscess can occur in or on almost any part of your body. Some abscesses break open (rupture) on their own. Most continue to get worse unless they are treated. The infection can spread deeper into the body and eventually into your blood, which can make you feel ill. Treatment usually involves draining the abscess. What are the causes? An abscess occurs when germs, often bacteria, pass through your skin and cause an infection. This may be caused by:  A scrape or cut on your skin.  A puncture wound through your skin, including a needle injection.  Blocked oil or sweat glands.  Blocked and infected hair follicles.  A cyst that forms beneath your skin (sebaceous cyst) and becomes infected. What increases the risk? This condition is more likely to develop in people who:  Have a weak body defense system (immune system).  Have diabetes.  Have dry and irritated skin.  Get frequent injections or use illegal IV drugs.  Have a foreign body in a  wound, such as a splinter.  Have problems with their lymph system or veins. What are the signs or symptoms? An abscess may start as a painful, firm bump under the skin. Over time, the abscess may get larger or become softer. Pus may appear at the top of the abscess, causing pressure and pain. It may eventually break through the skin and drain. Other symptoms include:  Redness.  Warmth.  Swelling.  Tenderness.  A sore on the skin. How is this diagnosed? This condition is diagnosed based on your medical history and a physical exam. A sample of pus may be taken from the abscess to find out what is causing the infection and what antibiotics can be used to treat it. You also may have:  Blood tests to look for signs of infection or spread of an infection to your blood.  Imaging studies such as ultrasound, CT scan, or MRI if the abscess is deep. How is this treated? Small abscesses that drain on their own may not need treatment. Treatment for an abscess that does not rupture on its own may include:  Warm compresses applied to the area several times per day.  Incision and drainage. Your health care provider will make an incision to open the abscess and will remove pus and any foreign body or dead tissue. The incision area may be packed with gauze to keep it open for a few days while it heals.  Antibiotic medicines to treat infection. For a severe abscess, you may first get  antibiotics through an IV and then change to oral antibiotics. Follow these instructions at home: Abscess Care   If you have an abscess that has not drained, place a warm, clean, wet washcloth over the abscess several times a day. Do this as told by your health care provider.  Follow instructions from your health care provider about how to take care of your abscess. Make sure you:  Cover the abscess with a bandage (dressing).  Change your dressing or gauze as told by your health care provider.  Wash your hands with  soap and water before you change the dressing or gauze. If soap and water are not available, use hand sanitizer.  Check your abscess every day for signs of a worsening infection. Check for:  More redness, swelling, or pain.  More fluid or blood.  Warmth.  More pus or a bad smell. Medicines   Take over-the-counter and prescription medicines only as told by your health care provider.  If you were prescribed an antibiotic medicine, take it as told by your health care provider. Do not stop taking the antibiotic even if you start to feel better. General instructions   To avoid spreading the infection:  Do not share personal care items, towels, or hot tubs with others.  Avoid making skin contact with other people.  Keep all follow-up visits as told by your health care provider. This is important. Contact a health care provider if:  You have more redness, swelling, or pain around your abscess.  You have more fluid or blood coming from your abscess.  Your abscess feels warm to the touch.  You have more pus or a bad smell coming from your abscess.  You have a fever.  You have muscle aches.  You have chills or a general ill feeling. Get help right away if:  You have severe pain.  You see red streaks on your skin spreading away from the abscess. This information is not intended to replace advice given to you by your health care provider. Make sure you discuss any questions you have with your health care provider. Document Released: 05/29/2005 Document Revised: 04/14/2016 Document Reviewed: 06/28/2015 Elsevier Interactive Patient Education  2017 [employer].

## 2016-12-07 NOTE — Progress Notes (Signed)
By signing my name below, I, [name], attest that this documentation has been prepared under the direction and in the presence of Delman Cheadle, MD.  Electronically Signed: Verlee Monte, Medical Scribe. 12/07/16. 9:55 AM.  Subjective:    Patient ID: Nicholas Rodriguez, male    DOB: Oct 16, 1960, 56 y.o.   MRN: [id number]  HPI Chief Complaint  Patient presents with  . Skin Problem    Possible MRSA on left hip area-noticed on Friday. Has H/O MRSA    HPI Comments: Nicholas Rodriguez is a 56 y.o. male with a PMHx of MRSA who presents to the Primary Care at [facility] complaining of lesion on his left hip onset tomorrow morning. Recurrent lesion on his left anterior, and multiple documents report MRSA.  Reports area is warm, and too sore to sleep on or sit on. He notes sitting often during his work day. Pt used a heating pack on it after work, took a Sports administrator, and took 1 pill of doxycycline for his sxs. He also has hibiclens for treatment and only uses it during his out breaks - does not use it everyday. States it's at the beginning stage of his outbreak and would like to stop it before it gets worse. Pt reports this occurs all the time and initially it occurred 3x a year with his last outbreak June 2017. Denies drainage.  Patient Active Problem List   Diagnosis Date Noted  . MRSA cellulitis 03/11/2014   Past Medical History:  Diagnosis Date  . Depression   . MRSA (methicillin resistant Staphylococcus aureus)    Past Surgical History:  Procedure Laterality Date  . DENTAL SURGERY    . INCISION AND DRAINAGE ABSCESS Right 03/14/2014   Procedure: INCISION AND DRAINAGE ABSCESS;  Surgeon: Nita Sells, MD;  Location: [facility];  Service: Orthopedics;  Laterality: Right;   Allergies  Allergen Reactions  . Latex Dermatitis    "burns skin"   Prior to Admission medications   Medication Sig Start Date End Date Taking? Authorizing Provider    Diclofenac Potassium 50 MG PACK Take by mouth.   Yes Historical Provider, MD   Social History   Social History  . Marital status: Married    Spouse name: N/A  . Number of children: N/A  . Years of education: N/A   Occupational History  . Not on file.   Social History Main Topics  . Smoking status: Never Smoker  . Smokeless tobacco: Never Used  . Alcohol use 7.2 oz/week    12 Cans of beer per week     Comment: social  . Drug use: Yes    Types: Marijuana     Comment: "last night"  . Sexual activity: Yes     Comment: condom   Other Topics Concern  . Not on file   Social History Narrative   ** Merged History Encounter **       Review of Systems  Skin: Positive for color change. Negative for wound.  Psychiatric/Behavioral: Positive for sleep disturbance.   Objective:  Physical Exam  Constitutional: He appears well-developed and well-nourished. No distress.  HENT:  Head: Normocephalic and atraumatic.  Eyes: Conjunctivae are normal.  Neck: Neck supple.  Cardiovascular: Normal rate.   Pulmonary/Chest: Effort normal.  Neurological: He is alert.  Skin: Skin is warm and dry.  Tender Mild blanching Erythema 6 cm diameter Central induration 2 cm but no central fluctuance 1 cm of a head  Psychiatric: He has a normal mood and affect. His behavior is normal.  Nursing note and vitals reviewed.  BP (!) 142/80   Pulse 65   Temp 98.2 F (36.8 C) (Oral)   Resp 18   Ht 5' 9.5" (1.765 m)   Wt 179 lb (81.2 kg)   SpO2 98%   BMI 26.05 kg/m  Assessment & Plan:   1. Elevated blood pressure reading - check outside office  2. Abscess of thigh - not always in the same place.  Pt aware of how to prevent with occ hibiclens body wash and nasal mupirocin for household, pt aware of use of topical and warm compresses immed upon beginning in effort avoid systemic abx, however, it appears by his chart that 1-2x/yr he does present when he needs systemic abx for developing abscess but  not yet I&D. Pt resports he sometimes requires prolonged course until gone so rx'ed but if resolved early will stop so he has a course for self-trx at home for future episode prn. Pt also tolerates bactrim well and mult prior clxs have shown MRSA sensitive to both bactrim and doxy.    Orders Placed This Encounter  Procedures  . Care order/instruction:    Scheduling Instructions:     Recheck BP  . Care order/instruction:    AVS printed - let patient go!    Meds ordered this encounter  Medications  . Diclofenac Potassium 50 MG PACK    Sig: Take by mouth.  . mupirocin ointment (BACTROBAN) 2 %    Sig: Apply 1 application topically 3 (three) times daily.    Dispense:  30 g    Refill:  2  . doxycycline (VIBRAMYCIN) 100 MG capsule    Sig: Take 1 capsule (100 mg total) by mouth 2 (two) times daily.    Dispense:  28 capsule    Refill:  0    I personally performed the services described in this documentation, which was scribed in my presence. The recorded information has been reviewed and considered, and addended by me as needed.   Delman Cheadle, M.D.  Primary Care at [facility] [address] [place], [place] [address] [phone] phone [phone] fax  12/09/16 8:10 AM

## 2019-06-03 ENCOUNTER — Ambulatory Visit (INDEPENDENT_AMBULATORY_CARE_PROVIDER_SITE_OTHER): Payer: BC Managed Care – PPO | Admitting: Family Medicine

## 2019-06-03 ENCOUNTER — Encounter: Payer: Self-pay | Admitting: Family Medicine

## 2019-06-03 ENCOUNTER — Other Ambulatory Visit: Payer: Self-pay

## 2019-06-03 VITALS — BP 136/83 | HR 57 | Temp 98.5°F | Resp 16 | Ht 69.5 in | Wt 176.6 lb

## 2019-06-03 DIAGNOSIS — R590 Localized enlarged lymph nodes: Secondary | ICD-10-CM | POA: Diagnosis not present

## 2019-06-03 DIAGNOSIS — L02411 Cutaneous abscess of right axilla: Secondary | ICD-10-CM

## 2019-06-03 DIAGNOSIS — Z8614 Personal history of Methicillin resistant Staphylococcus aureus infection: Secondary | ICD-10-CM | POA: Diagnosis not present

## 2019-06-03 MED ORDER — DOXYCYCLINE HYCLATE 100 MG PO TABS: 100.0000 mg | ORAL_TABLET | Freq: Two times a day (BID) | ORAL | 0 refills | Status: DC

## 2019-06-03 NOTE — Progress Notes (Signed)
Patient ID: Nicholas Rodriguez, male    DOB: 1961/05/10  Age: 58 y.o. MRN: [id number]  Chief Complaint  Patient presents with  . Arm issue    x1 week; knot/cyst under right arm; is getting bigger; not painful more uncomfortable with arm movement; denies drainage    Subjective:   Patient has history of having had a lot of MRSA infections over the years.  He has a place that is been swelling up in his right axillary area for the last 6 days.  It is painful.  He works replacing window glass.  Current allergies, medications, problem list, past/family and social histories reviewed.  Objective:  BP 136/83   Pulse (!) 57   Temp 98.5 F (36.9 C) (Oral)   Resp 16   Ht 5' 9.5" (1.765 m)   Wt 176 lb 9.6 oz (80.1 kg)   SpO2 99%   BMI 25.71 kg/m   No major acute distress.  He has a cystic feeling abscess of the superior aspect of the right axilla that is about 2 to 3 cm in diameter with erythema.  Medial to that there is a smaller place which is mildly erythematous about 1 cm.  Neither of these are coming to ahead yet.  Deep and inferior to these areas there are 2 or 3 large node like lesions 2 or 3 cm each.  The whole area of swelling comes to about 5 to 6 cm.  The nodes feel a little firmer and not cystic and I do not believe they are abscesses.  They are deeper.  Procedure note: The abscess area was numbed up with 1% lidocaine with epinephrine.  I&D was performed.  A large amount of pus was expressed.  It was sounded to about 3 to 3-1/2 cm.  Packing was placed.  The patient tolerated well.  Assessment & Plan:   Assessment: 1. Abscess of right axilla   2. Lymphadenopathy, axillary   3. History of MRSA infection       Plan: See instructions.  Prescription sent in.  Patient says he is tolerated doxycycline better than sulfamethoxazole, so that was used.  Orders Placed This Encounter  Procedures  . WOUND CULTURE    Order Specific Question:   Source    Answer:   right axilla    Meds  ordered this encounter  Medications  . doxycycline (VIBRA-TABS) 100 MG tablet    Sig: Take 1 tablet (100 mg total) by mouth 2 (two) times daily.    Dispense:  28 tablet    Refill:  0         Patient Instructions     If it seems to be stable tomorrow, cancel the appointment.  If you think that the area is swelling worse come on in.  Take doxycycline 100 mg 1 twice daily.  Take for at least 10 days, but if any question that it is not fully resolved take for the full 2 weeks.  On Saturday remove some of the packing.  If possible pull on the little tab and get about 2 inches out and trim it off.  Then on Monday remove the rest.  If all of it comes out on Saturday, do not worry about it.  Return or go to the emergency room if worse.  If the deeper nodes continue to swell we might have to refer you to a general surgeon also.   If you have lab work done today you will be contacted with your lab  results within the next 2 weeks.  If you have not heard from [name] then please contact us. The fastest way to get your results is to register for My Chart.   IF you received an x-ray today, you will receive an invoice from [facility]. Please contact [facility] at [phone] with questions or concerns regarding your invoice.   IF you received labwork today, you will receive an invoice from [facility]. Please contact [facility] at [phone] with questions or concerns regarding your invoice.   Our billing staff will not be able to assist you with questions regarding bills from these companies.  You will be contacted with the lab results as soon as they are available. The fastest way to get your results is to activate your My Chart account. Instructions are located on the last page of this paperwork. If you have not heard from [name] regarding the results in 2 weeks, please contact this office.         Return in about 1 day (around 06/04/2019), or Annalina Needles, for Wound  recheck.   Ruben Reason, MD 06/03/2019

## 2019-06-03 NOTE — Patient Instructions (Addendum)
   If it seems to be stable tomorrow, cancel the appointment.  If you think that the area is swelling worse come on in.  Take doxycycline 100 mg 1 twice daily.  Take for at least 10 days, but if any question that it is not fully resolved take for the full 2 weeks.  On Saturday remove some of the packing.  If possible pull on the little tab and get about 2 inches out and trim it off.  Then on Monday remove the rest.  If all of it comes out on Saturday, do not worry about it.  Return or go to the emergency room if worse.  If the deeper nodes continue to swell we might have to refer you to a general surgeon also.   If you have lab work done today you will be contacted with your lab results within the next 2 weeks.  If you have not heard from [name] then please contact us. The fastest way to get your results is to register for My Chart.   IF you received an x-ray today, you will receive an invoice from [facility]. Please contact [facility] at [phone] with questions or concerns regarding your invoice.   IF you received labwork today, you will receive an invoice from [facility]. Please contact [facility] at [phone] with questions or concerns regarding your invoice.   Our billing staff will not be able to assist you with questions regarding bills from these companies.  You will be contacted with the lab results as soon as they are available. The fastest way to get your results is to activate your My Chart account. Instructions are located on the last page of this paperwork. If you have not heard from [name] regarding the results in 2 weeks, please contact this office.

## 2019-06-04 ENCOUNTER — Encounter: Payer: Self-pay | Admitting: Registered Nurse

## 2019-06-04 ENCOUNTER — Ambulatory Visit (INDEPENDENT_AMBULATORY_CARE_PROVIDER_SITE_OTHER): Payer: Managed Care, Other (non HMO) | Admitting: Registered Nurse

## 2019-06-04 ENCOUNTER — Other Ambulatory Visit: Payer: Self-pay

## 2019-06-04 VITALS — BP 143/73 | HR 86 | Temp 99.0°F | Resp 16 | Wt 176.0 lb

## 2019-06-04 DIAGNOSIS — R590 Localized enlarged lymph nodes: Secondary | ICD-10-CM | POA: Insufficient documentation

## 2019-06-04 DIAGNOSIS — L02411 Cutaneous abscess of right axilla: Secondary | ICD-10-CM | POA: Diagnosis not present

## 2019-06-04 NOTE — Patient Instructions (Signed)
° ° ° °  If you have lab work done today you will be contacted with your lab results within the next 2 weeks.  If you have not heard from us then please contact us. The fastest way to get your results is to register for My Chart. ° ° °IF you received an x-ray today, you will receive an invoice from [facility]. Please contact [facility] at [phone] with questions or concerns regarding your invoice.  ° °IF you received labwork today, you will receive an invoice from [facility]. Please contact [facility] at [phone] with questions or concerns regarding your invoice.  ° °Our billing staff will not be able to assist you with questions regarding bills from these companies. ° °You will be contacted with the lab results as soon as they are available. The fastest way to get your results is to activate your My Chart account. Instructions are located on the last page of this paperwork. If you have not heard from us regarding the results in 2 weeks, please contact this office. °  ° ° ° °

## 2019-06-04 NOTE — Progress Notes (Signed)
Established Patient Office Visit  Subjective:  Patient ID: [name], male    DOB: 03/17/1961  Age: 58 y.o. MRN: [id number]  CC:  Chief Complaint  Patient presents with  . Cyst    follow-up from yesterday pt states the swelling has went down but noticed another bump.     HPI Nicholas Rodriguez presents for wound check  Presented yesterday with 2 lumps in R axilla. H/o MRSA infection, seen by Dr. Linna Darner who performed I&D and packed the wound with gauze.  States that this morning, he has noted a third lump inferior to the first two. He notes it is tender, but feels deeper than the other two. Unlike his history of MRSA infections, none of these have "come to a head". He states that while he is not concerned about the first two, the presentation of the third lump was concerning. He denies any systemic symptoms of infection. Endorses some diarrhea, but has started on doxycycline, so this is expected.   Past Medical History:  Diagnosis Date  . Depression   . MRSA (methicillin resistant Staphylococcus aureus)     Past Surgical History:  Procedure Laterality Date  . DENTAL SURGERY    . INCISION AND DRAINAGE ABSCESS Right 03/14/2014   Procedure: INCISION AND DRAINAGE ABSCESS;  Surgeon: Nita Sells, MD;  Location: [place];  Service: Orthopedics;  Laterality: Right;    History reviewed. No pertinent family history.  Social History   Socioeconomic History  . Marital status: Married    Spouse name: Not on file  . Number of children: Not on file  . Years of education: Not on file  . Highest education level: Not on file  Occupational History  . Not on file  Social Needs  . Financial resource strain: Not on file  . Food insecurity    Worry: Not on file    Inability: Not on file  . Transportation needs    Medical: Not on file    Non-medical: Not on file  Tobacco Use  . Smoking status: Never Smoker  . Smokeless tobacco: Never Used  Substance and  Sexual Activity  . Alcohol use: Yes    Alcohol/week: 12.0 standard drinks    Types: 12 Cans of beer per week    Comment: social  . Drug use: Yes    Types: Marijuana    Comment: "last night"  . Sexual activity: Yes    Comment: condom  Lifestyle  . Physical activity    Days per week: Not on file    Minutes per session: Not on file  . Stress: Not on file  Relationships  . Social Herbalist on phone: Not on file    Gets together: Not on file    Attends religious service: Not on file    Active member of club or organization: Not on file    Attends meetings of clubs or organizations: Not on file    Relationship status: Not on file  . Intimate partner violence    Fear of current or ex partner: Not on file    Emotionally abused: Not on file    Physically abused: Not on file    Forced sexual activity: Not on file  Other Topics Concern  . Not on file  Social History Narrative   ** Merged History Encounter **        Outpatient Medications Prior to Visit  Medication Sig Dispense Refill  . doxycycline (VIBRA-TABS)  100 MG tablet Take 1 tablet (100 mg total) by mouth 2 (two) times daily. 28 tablet 0  . IBUPROFEN PO Take by mouth.     No facility-administered medications prior to visit.     Allergies  Allergen Reactions  . Latex Dermatitis    "burns skin"    ROS Review of Systems  Constitutional: Negative.  Negative for chills, fatigue and fever.  HENT: Negative.   Eyes: Negative.   Respiratory: Negative.  Negative for shortness of breath.   Cardiovascular: Negative.  Negative for chest pain.  Gastrointestinal: Positive for diarrhea (d/t doxycycline). Negative for constipation, nausea and vomiting.  Endocrine: Negative.   Genitourinary: Negative.   Musculoskeletal: Negative.  Negative for arthralgias, joint swelling and myalgias.  Skin: Positive for rash and wound.       R axilla  Allergic/Immunologic: Negative.   Neurological: Negative.   Hematological:  Negative.   Psychiatric/Behavioral: Negative.   All other systems reviewed and are negative.     Objective:    Physical Exam  Constitutional: He is oriented to person, place, and time. He appears well-developed and well-nourished. No distress.  Cardiovascular: Normal rate and regular rhythm.  Pulmonary/Chest: Effort normal. No respiratory distress.  Neurological: He is alert and oriented to person, place, and time.  Skin: Skin is warm and dry. No rash noted. He is not diaphoretic. No erythema. No pallor.  R axilla: I&D site visible with gauze packing about 1 in exposed. Purulent drainage. 1 lesion medial, 1 lesion inferior. Both mildly erythematous, not concerning at this time. Inferior lesion likely lymphadenopathy of axillary node. Tenderness of inferior lesion "feels deeper, feels different".   Psychiatric: He has a normal mood and affect. His behavior is normal. Judgment and thought content normal.  Nursing note and vitals reviewed.   BP (!) 143/73   Pulse 86   Temp 99 F (37.2 C) (Oral)   Resp 16   Wt 176 lb (79.8 kg)   SpO2 99%   BMI 25.62 kg/m  Wt Readings from Last 3 Encounters:  06/04/19 176 lb (79.8 kg)  06/03/19 176 lb 9.6 oz (80.1 kg)  12/07/16 179 lb (81.2 kg)     Health Maintenance Due  Topic Date Due  . Hepatitis C Screening  09[birth year]11/[birth year]  . HIV Screening  04/13/1978  . TETANUS/TDAP  04/13/1982  . COLONOSCOPY  04/13/2013    There are no preventive care reminders to display for this patient.  No results found for: TSH Lab Results  Component Value Date   WBC 8.9 03/20/2016   HGB 15.8 03/20/2016   HCT 44.5 03/20/2016   MCV 84.4 03/20/2016   No results found for: NA, K, CHLORIDE, CO2, GLUCOSE, BUN, CREATININE, BILITOT, ALKPHOS, AST, ALT, PROT, ALBUMIN, CALCIUM, ANIONGAP, EGFR, GFR No results found for: CHOL No results found for: HDL No results found for: LDLCALC No results found for: TRIG No results found for: CHOLHDL No results found for: HGBA1C     Assessment & Plan:   Problem List Items Addressed This Visit    None    Visit Diagnoses    Abscess of right axilla    -  Primary   Lymphadenopathy, axillary          No orders of the defined types were placed in this encounter.   Follow-up: Return in about 5 days (around 06/09/2019) for wound check.   PLAN  New lesion likely lymphadenopathy resulting from recent I&D and excess purulent drainage from abscess site. Continue to care  for the wound as Dr. Linna Darner suggested.  Follow up with myself early to mid next week for assessment  Reviewed sxs of systemic infection and reasons to present to urgent care or ED  Patient encouraged to call clinic with any questions, comments, or concerns.   Maximiano Coss, NP

## 2019-06-08 ENCOUNTER — Encounter: Payer: Self-pay | Admitting: Registered Nurse

## 2019-06-08 ENCOUNTER — Ambulatory Visit (INDEPENDENT_AMBULATORY_CARE_PROVIDER_SITE_OTHER): Payer: BC Managed Care – PPO | Admitting: Registered Nurse

## 2019-06-08 ENCOUNTER — Other Ambulatory Visit: Payer: Self-pay

## 2019-06-08 VITALS — BP 134/77 | HR 96 | Temp 98.0°F | Resp 16 | Ht 69.0 in | Wt 176.0 lb

## 2019-06-08 DIAGNOSIS — Z5189 Encounter for other specified aftercare: Secondary | ICD-10-CM | POA: Diagnosis not present

## 2019-06-08 LAB — WOUND CULTURE

## 2019-06-08 NOTE — Progress Notes (Signed)
Acute Office Visit  Subjective:    Patient ID: Nicholas Rodriguez, male    DOB: December 23, 1960, 58 y.o.   MRN: [id number]  Chief Complaint  Patient presents with  . Wound Check    5 day follow-up   . Lab Review    HPI Patient is in today for wound check  Presented originally on Oct 1 with Dr. Linna Darner for apparent cyst in R axilla. Dr. Linna Darner performed I&D with gauze packing on largest of what ended up being 3 locations suspicious for cyst. Pt notes history of MRSA infection, has had similar cysts before. Has been taking doxycycline 182m PO bid as prescribed. No negative side effects noted.  Today, notes that swelling has continued to diminish. No more discharge. Pain has gone down. He has concern for what he suspects is lymph node swelling and tenderness in his R axillary node.  Denies sxs of systemic infection/sepsis including fever, chills, malaise, NVD  Past Medical History:  Diagnosis Date  . Depression   . MRSA (methicillin resistant Staphylococcus aureus)     Past Surgical History:  Procedure Laterality Date  . DENTAL SURGERY    . INCISION AND DRAINAGE ABSCESS Right 03/14/2014   Procedure: INCISION AND DRAINAGE ABSCESS;  Surgeon: JNita Sells MD;  [place]  Service: Orthopedics;  Laterality: Right;    History reviewed. No pertinent family history.  Social History   Socioeconomic History  . Marital status: Married    Spouse name: Not on file  . Number of children: Not on file  . Years of education: Not on file  . Highest education level: Not on file  Occupational History  . Not on file  Social Needs  . Financial resource strain: Not on file  . Food insecurity    Worry: Not on file    Inability: Not on file  . Transportation needs    Medical: Not on file    Non-medical: Not on file  Tobacco Use  . Smoking status: Never Smoker  . Smokeless tobacco: Never Used  Substance and Sexual Activity  . Alcohol use: Yes   Alcohol/week: 12.0 standard drinks    Types: 12 Cans of beer per week    Comment: social  . Drug use: Yes    Types: Marijuana    Comment: "last night"  . Sexual activity: Yes    Comment: condom  Lifestyle  . Physical activity    Days per week: Not on file    Minutes per session: Not on file  . Stress: Not on file  Relationships  . Social c[place]bal[place] phone: Not on file    Gets together: Not on file    Attends religious service: Not on file    Active member of club or organization: Not on file    Attends meetings of clubs or organizations: Not on file    Relationship status: Not on file  . Intimate partner violence    Fear of current or ex partner: Not on file    Emotionally abused: Not on file    Physically abused: Not on file    Forced sexual activity: Not on file  Other Topics Concern  . Not on file  Social History Narrative   ** Merged History Encounter **        Outpatient Medications Prior to Visit  Medication Sig Dispense Refill  . doxycycline (VIBRA-TABS) 100 MG tablet Take 1 tablet (100 mg total) by mouth 2 (  two) times daily. 28 tablet 0  . IBUPROFEN PO Take by mouth.     No facility-administered medications prior to visit.     Allergies  Allergen Reactions  . Latex Dermatitis    "burns skin"    ROS Per hpi     Objective:    Physical Exam  Constitutional: He is oriented to person, place, and time. He appears well-developed and well-nourished. No distress.  Cardiovascular: Normal rate and regular rhythm.  Pulmonary/Chest: Effort normal. No respiratory distress.  Neurological: He is alert and oriented to person, place, and time.  Skin: Skin is warm and dry. No rash noted. He is not diaphoretic. There is erythema (around border of bandage). No pallor.  Psychiatric: He has a normal mood and affect. His behavior is normal. Judgment normal.  Nursing note and vitals reviewed.  Lesions appear to be healing well. Cysts notably smaller than at  previous visit. R axillary lymph node swollen and tender to palpation, not fixed. We discussed that this is expected following infection and treatment, particularly for I&D  Reviewed wound culture results, S. Aureus found. Appears to be responding well to doxycyline, however, if this recurs, we have a list of susceptibilities available.  BP 134/77   Pulse 96   Temp 98 F (36.7 C) (Oral)   Resp 16   Ht 5' 9"  (1.753 m)   Wt 176 lb (79.8 kg)   SpO2 98%   BMI 25.99 kg/m  Wt Readings from Last 3 Encounters:  06/08/19 176 lb (79.8 kg)  06/04/19 176 lb (79.8 kg)  06/03/19 176 lb 9.6 oz (80.1 kg)    Health Maintenance Due  Topic Date Due  . Hepatitis C Screening  11-05-1960  . HIV Screening  04/13/1978  . TETANUS/TDAP  04/13/1982  . COLONOSCOPY  04/13/2013    There are no preventive care reminders to display for this patient.   No results found for: TSH Lab Results  Component Value Date   WBC 8.9 03/20/2016   HGB 15.8 03/20/2016   HCT 44.5 03/20/2016   MCV 84.4 03/20/2016   No results found for: NA, K, CHLORIDE, CO2, GLUCOSE, BUN, CREATININE, BILITOT, ALKPHOS, AST, ALT, PROT, ALBUMIN, CALCIUM, ANIONGAP, EGFR, GFR No results found for: CHOL No results found for: HDL No results found for: LDLCALC No results found for: TRIG No results found for: CHOLHDL No results found for: HGBA1C     Assessment & Plan:   Problem List Items Addressed This Visit    None       No orders of the defined types were placed in this encounter.    Maximiano Coss, NP

## 2019-06-08 NOTE — Patient Instructions (Signed)
° ° ° °  If you have lab work done today you will be contacted with your lab results within the next 2 weeks.  If you have not heard from us then please contact us. The fastest way to get your results is to register for My Chart. ° ° °IF you received an x-ray today, you will receive an invoice from [facility]. Please contact [facility] at [phone] with questions or concerns regarding your invoice.  ° °IF you received labwork today, you will receive an invoice from [facility]. Please contact [facility] at [phone] with questions or concerns regarding your invoice.  ° °Our billing staff will not be able to assist you with questions regarding bills from these companies. ° °You will be contacted with the lab results as soon as they are available. The fastest way to get your results is to activate your My Chart account. Instructions are located on the last page of this paperwork. If you have not heard from us regarding the results in 2 weeks, please contact this office. °  ° ° ° °

## 2019-06-09 ENCOUNTER — Other Ambulatory Visit: Payer: Self-pay | Admitting: Family Medicine

## 2019-06-09 DIAGNOSIS — L02411 Cutaneous abscess of right axilla: Secondary | ICD-10-CM

## 2019-06-09 MED ORDER — SULFAMETHOXAZOLE-TRIMETHOPRIM 800-160 MG PO TABS: 1.0000 | ORAL_TABLET | Freq: Two times a day (BID) | ORAL | 0 refills | Status: DC

## 2019-06-15 ENCOUNTER — Encounter: Payer: Self-pay | Admitting: Radiology

## 2019-06-17 ENCOUNTER — Telehealth: Payer: Self-pay | Admitting: Registered Nurse

## 2019-06-17 NOTE — Telephone Encounter (Signed)
Copied from [place] [id number]. Topic: General - Other >> Jun 17, 2019 12:11 PM Rainey Pines A wrote: Patient would like a callback from nurse to go over most recent test results

## 2019-06-21 NOTE — Telephone Encounter (Signed)
Attempted to call pt no answer. Left message to call back.  If he wants to know about his wound culture results it is stated below.  From Provider: "Staph on culture is possibly resistant to doxycycline so I changed him to generic bactrim (sulfa/trimeth). He is to discontinue the doxy and continue with this. I tried to call him. "  Thanks

## 2019-12-09 ENCOUNTER — Ambulatory Visit (INDEPENDENT_AMBULATORY_CARE_PROVIDER_SITE_OTHER): Payer: BC Managed Care – PPO | Admitting: Adult Health Nurse Practitioner

## 2019-12-09 ENCOUNTER — Other Ambulatory Visit: Payer: Self-pay

## 2019-12-09 ENCOUNTER — Encounter: Payer: Self-pay | Admitting: Adult Health Nurse Practitioner

## 2019-12-09 VITALS — BP 134/74 | HR 68 | Temp 98.2°F | Ht 71.0 in | Wt 178.4 lb

## 2019-12-09 DIAGNOSIS — Z8614 Personal history of Methicillin resistant Staphylococcus aureus infection: Secondary | ICD-10-CM | POA: Insufficient documentation

## 2019-12-09 DIAGNOSIS — L0231 Cutaneous abscess of buttock: Secondary | ICD-10-CM | POA: Insufficient documentation

## 2019-12-09 MED ORDER — SULFAMETHOXAZOLE-TRIMETHOPRIM 800-160 MG PO TABS: 1.0000 | ORAL_TABLET | Freq: Two times a day (BID) | ORAL | 2 refills | Status: AC

## 2019-12-09 MED ORDER — MUPIROCIN 2 % EX OINT: 1.0000 "application " | TOPICAL_OINTMENT | Freq: Two times a day (BID) | CUTANEOUS | 3 refills | Status: DC

## 2019-12-09 NOTE — Progress Notes (Signed)
SUBJECTIVE:  Nicholas Rodriguez is a 59 y.o. male with recurrent abscesses.  Wound culture in 2017 grew MRSA.    OBJECTIVE:  Appears well, alert, oriented, pleasant and cooperative. Vitals are as noted.  ASSESSMENT: normal complete skin exam, no suspicious lesions, abscess noted to buttock.    1. Abscess of buttock, left   2. History of MRSA infection      PLAN:  Meds ordered this encounter  Medications  . sulfamethoxazole-trimethoprim (BACTRIM DS) 800-160 MG tablet    Sig: Take 1 tablet by mouth 2 (two) times daily for 14 days.    Dispense:  28 tablet    Refill:  2  . mupirocin ointment (BACTROBAN) 2 %    Sig: Place 1 application into the nose 2 (two) times daily.    Dispense:  22 g    Refill:  3

## 2019-12-09 NOTE — Patient Instructions (Signed)
° ° ° °  If you have lab work done today you will be contacted with your lab results within the next 2 weeks.  If you have not heard from us then please contact us. The fastest way to get your results is to register for My Chart. ° ° °IF you received an x-ray today, you will receive an invoice from [facility]. Please contact [facility] at [phone] with questions or concerns regarding your invoice.  ° °IF you received labwork today, you will receive an invoice from [facility]. Please contact [facility] at [phone] with questions or concerns regarding your invoice.  ° °Our billing staff will not be able to assist you with questions regarding bills from these companies. ° °You will be contacted with the lab results as soon as they are available. The fastest way to get your results is to activate your My Chart account. Instructions are located on the last page of this paperwork. If you have not heard from us regarding the results in 2 weeks, please contact this office. °  ° ° ° °

## 2020-03-20 ENCOUNTER — Other Ambulatory Visit: Payer: Self-pay

## 2020-03-20 ENCOUNTER — Encounter: Payer: Self-pay | Admitting: Registered Nurse

## 2020-03-20 ENCOUNTER — Ambulatory Visit (INDEPENDENT_AMBULATORY_CARE_PROVIDER_SITE_OTHER): Payer: BC Managed Care – PPO | Admitting: Registered Nurse

## 2020-03-20 ENCOUNTER — Ambulatory Visit: Payer: BC Managed Care – PPO | Admitting: Registered Nurse

## 2020-03-20 VITALS — BP 149/80 | HR 49 | Temp 98.4°F | Resp 15 | Ht 71.0 in | Wt 178.8 lb

## 2020-03-20 DIAGNOSIS — Z1211 Encounter for screening for malignant neoplasm of colon: Secondary | ICD-10-CM | POA: Diagnosis not present

## 2020-03-20 DIAGNOSIS — S61511S Laceration without foreign body of right wrist, sequela: Secondary | ICD-10-CM

## 2020-03-20 DIAGNOSIS — Z23 Encounter for immunization: Secondary | ICD-10-CM

## 2020-03-20 DIAGNOSIS — S61511A Laceration without foreign body of right wrist, initial encounter: Secondary | ICD-10-CM | POA: Diagnosis not present

## 2020-03-20 MED ORDER — SULFAMETHOXAZOLE-TRIMETHOPRIM 800-160 MG PO TABS: 1.0000 | ORAL_TABLET | Freq: Two times a day (BID) | ORAL | 0 refills | Status: DC

## 2020-03-20 NOTE — Patient Instructions (Signed)
° ° ° °  If you have lab work done today you will be contacted with your lab results within the next 2 weeks.  If you have not heard from us then please contact us. The fastest way to get your results is to register for My Chart. ° ° °IF you received an x-ray today, you will receive an invoice from [facility]. Please contact [facility] at [phone] with questions or concerns regarding your invoice.  ° °IF you received labwork today, you will receive an invoice from [facility]. Please contact [facility] at [phone] with questions or concerns regarding your invoice.  ° °Our billing staff will not be able to assist you with questions regarding bills from these companies. ° °You will be contacted with the lab results as soon as they are available. The fastest way to get your results is to activate your My Chart account. Instructions are located on the last page of this paperwork. If you have not heard from us regarding the results in 2 weeks, please contact this office. °  ° ° ° °

## 2020-03-20 NOTE — Progress Notes (Signed)
Acute Office Visit  Subjective:    Patient ID: Nicholas Rodriguez, male    DOB: 01-11-1961, 59 y.o.   MRN: [id number]  Chief Complaint  Patient presents with  . Laceration    pt had an injury on his Rt outter wrist, pt visit hospital had XRs and several stitches report was overall good but pt is having pain and swelling apx 14 days out from original injury   . Referral    pt also needs referral to do colonoscopy    HPI Patient is in today for wound check  2 weeks ago cut his arm on glass at work. States that it was a very clean appearing cut - no dirt or debris noted. Went to urgent care in [place] and rec'd stitches which have since been removed.   No drainage from the wound  However, now notes that it is swelling and he is having ongoing pain into his hand and up his arm. Still no drainage or redness, but he does have a history of multiple staph infections (MRSA). Has not been wounded this deeply in the past concerned for nerve damage. Was told at urgent care that he did not have any damaged tendons or ligaments.  Past Medical History:  Diagnosis Date  . Depression   . MRSA (methicillin resistant Staphylococcus aureus)     Past Surgical History:  Procedure Laterality Date  . DENTAL SURGERY    . INCISION AND DRAINAGE ABSCESS Right 03/14/2014   Procedure: INCISION AND DRAINAGE ABSCESS;  Surgeon: Nita Sells, MD;  Location: [facility];  Service: Orthopedics;  Laterality: Right;    No family history on file.  Social History   Socioeconomic History  . Marital status: Married    Spouse name: Not on file  . Number of children: Not on file  . Years of education: Not on file  . Highest education level: Not on file  Occupational History  . Not on file  Tobacco Use  . Smoking status: Never Smoker  . Smokeless tobacco: Never Used  Vaping Use  . Vaping Use: Never used  Substance and Sexual Activity  . Alcohol use: Yes    Alcohol/week: 12.0  standard drinks    Types: 12 Cans of beer per week    Comment: social  . Drug use: Yes    Types: Marijuana    Comment: "last night"  . Sexual activity: Yes    Comment: condom  Other Topics Concern  . Not on file  Social History Narrative   ** Merged History Encounter **       Social Determinants of Health   Financial Resource Strain:   . Difficulty of Paying Living Expenses:   Food Insecurity:   . Worried About Charity fundraiser in the Last Year:   . Arboriculturist in the Last Year:   Transportation Needs:   . Film/video editor (Medical):   Marland Kitchen Lack of Transportation (Non-Medical):   Physical Activity:   . Days of Exercise per Week:   . Minutes of Exercise per Session:   Stress:   . Feeling of Stress :   Social Connections:   . Frequency of Communication with Friends and Family:   . Frequency of Social Gatherings with Friends and Family:   . Attends Religious Services:   . Active Member of Clubs or Organizations:   . Attends Archivist Meetings:   Marland Kitchen Marital Status:   Intimate Partner Violence:   .  Fear of Current or Ex-Partner:   . Emotionally Abused:   Marland Kitchen Physically Abused:   . Sexually Abused:     Outpatient Medications Prior to Visit  Medication Sig Dispense Refill  . doxycycline (VIBRA-TABS) 100 MG tablet Take 1 tablet (100 mg total) by mouth 2 (two) times daily. (Patient not taking: Reported on 03/20/2020) 28 tablet 0  . IBUPROFEN PO Take by mouth. (Patient not taking: Reported on 03/20/2020)    . mupirocin ointment (BACTROBAN) 2 % Place 1 application into the nose 2 (two) times daily. (Patient not taking: Reported on 03/20/2020) 22 g 3   No facility-administered medications prior to visit.    Allergies  Allergen Reactions  . Latex Dermatitis    "burns skin"    Review of Systems  Constitutional: Negative.   HENT: Negative.   Eyes: Negative.   Respiratory: Negative.   Cardiovascular: Negative.   Gastrointestinal: Negative.   Endocrine:  Negative.   Genitourinary: Negative.   Musculoskeletal: Negative.   Skin: Positive for wound.  Allergic/Immunologic: Negative.   Neurological: Negative.   Hematological: Negative.   Psychiatric/Behavioral: Negative.   All other systems reviewed and are negative.      Objective:    Physical Exam Constitutional:      General: He is not in acute distress.    Appearance: Normal appearance. He is normal weight. He is not ill-appearing, toxic-appearing or diaphoretic.  Cardiovascular:     Rate and Rhythm: Normal rate and regular rhythm.  Pulmonary:     Effort: Pulmonary effort is normal. No respiratory distress.  Skin:    General: Skin is warm and dry.     Capillary Refill: Capillary refill takes less than 2 seconds.     Coloration: Skin is not jaundiced or pale.     Findings: Lesion (4" laceration on medial right wrist. healed appropriately but swollen and apparently fluid filled.) present. No bruising, erythema or rash.  Neurological:     General: No focal deficit present.     Mental Status: He is alert and oriented to person, place, and time. Mental status is at baseline.  Psychiatric:        Mood and Affect: Mood normal.        Behavior: Behavior normal.        Thought Content: Thought content normal.        Judgment: Judgment normal.     BP (!) 149/80   Pulse (!) 49   Temp 98.4 F (36.9 C) (Temporal)   Resp 15   Ht 5' 11" (1.803 m)   Wt 178 lb 12.8 oz (81.1 kg)   SpO2 99%   BMI 24.94 kg/m  Wt Readings from Last 3 Encounters:  03/20/20 178 lb 12.8 oz (81.1 kg)  12/09/19 178 lb 6.4 oz (80.9 kg)  06/08/19 176 lb (79.8 kg)    Health Maintenance Due  Topic Date Due  . Hepatitis C Screening  Never done  . HIV Screening  Never done  . COLONOSCOPY  Never done    There are no preventive care reminders to display for this patient.   No results found for: TSH Lab Results  Component Value Date   WBC 8.9 03/20/2016   HGB 15.8 03/20/2016   HCT 44.5 03/20/2016    MCV 84.4 03/20/2016   No results found for: NA, K, CHLORIDE, CO2, GLUCOSE, BUN, CREATININE, BILITOT, ALKPHOS, AST, ALT, PROT, ALBUMIN, CALCIUM, ANIONGAP, EGFR, GFR No results found for: CHOL No results found for: HDL No results found  for: [place] No results found for: TRIG No results found for: CHOLHDL No results found for: HGBA1C     Assessment & Plan:   Problem List Items Addressed This Visit    None    Visit Diagnoses    Special screening for malignant neoplasms, colon    -  Primary   Relevant Orders   Ambulatory referral to Gastroenterology   Wrist laceration, right, sequela       Relevant Medications   sulfamethoxazole-trimethoprim (BACTRIM DS) 800-160 MG tablet       No orders of the defined types were placed in this encounter.  PLAN  Given swelling and no abx given in urgent care, will give 10 day course of bactrim with strict precautions for signs of infection  Will refer to hand specialist for concern for sequela from this injury  Patient encouraged to call clinic with any questions, comments, or concerns.   Maximiano Coss, NP

## 2020-04-04 ENCOUNTER — Encounter: Payer: Self-pay | Admitting: Orthopaedic Surgery

## 2020-04-04 ENCOUNTER — Other Ambulatory Visit: Payer: Self-pay

## 2020-04-04 ENCOUNTER — Ambulatory Visit (INDEPENDENT_AMBULATORY_CARE_PROVIDER_SITE_OTHER): Payer: BLUE CROSS/BLUE SHIELD | Admitting: Orthopaedic Surgery

## 2020-04-04 VITALS — Ht 70.47 in | Wt 180.6 lb

## 2020-04-04 DIAGNOSIS — S61511A Laceration without foreign body of right wrist, initial encounter: Secondary | ICD-10-CM | POA: Diagnosis not present

## 2020-04-04 NOTE — Progress Notes (Signed)
Office Visit Note   Patient: Nicholas Rodriguez           Date of Birth: 02-Feb-1961           MRN: [id number] Visit Date: 04/04/2020              Requested by: Maximiano Coss, NP [place],  [place] [id number] PCP: Maximiano Coss, NP   Assessment & Plan: Visit Diagnoses:  1. Laceration of right wrist with complication, initial encounter     Plan: Impression is traumatic laceration to the right wrist concerning for ECU tendon laceration.  We will need to obtain an MRI ASAP to fully evaluate for this.  Patient will follow-up after the MRI.  Follow-Up Instructions: Return if symptoms worsen or fail to improve.   Orders:  No orders of the defined types were placed in this encounter.  No orders of the defined types were placed in this encounter.     Procedures: No procedures performed   Clinical Data: No additional findings.   Subjective: No chief complaint on file.   Nicholas Rodriguez is 59 year old gentleman referral from PCP who comes in for chronic right wrist pain and weakness due to traumatic laceration from a piece of glass that he sustained about a month ago.  He was initially evaluated in the ER and urgent care.  X-rays were negative by report and the laceration was repaired in the ER under local anesthetic.  He failed to follow-up with a specialist.  He comes in for continued symptoms including weakness of grip and difficulty throwing football.  He has an achy pain that is worse with supination of the hand.   Review of Systems  Constitutional: Negative.   All other systems reviewed and are negative.    Objective: Vital Signs: Ht 5' 10.47" (1.79 m)   Wt 180 lb 9.6 oz (81.9 kg)   BMI 25.57 kg/m   Physical Exam Vitals and nursing note reviewed.  Constitutional:      Appearance: He is well-developed.  HENT:     Head: Normocephalic and atraumatic.  Eyes:     Pupils: Pupils are equal, round, and reactive to light.  Pulmonary:     Effort: Pulmonary effort is  normal.  Abdominal:     Palpations: Abdomen is soft.  Musculoskeletal:        General: Normal range of motion.     Cervical back: Neck supple.  Skin:    General: Skin is warm.  Neurological:     Mental Status: He is alert and oriented to person, place, and time.  Psychiatric:        Behavior: Behavior normal.        Thought Content: Thought content normal.        Judgment: Judgment normal.     Ortho Exam Right wrist exam shows a fully healed traumatic laceration that is transverse over the ulnar aspect of the distal ulna.  He has reproducible pain with resisted ulnar deviation of the wrist and angulation of the FCU and ECU tendons.  In no neurovascular compromise. Specialty Comments:  No specialty comments available.  Imaging: No results found.   PMFS History: Patient Active Problem List   Diagnosis Date Noted  . Laceration of right wrist with complication 53/61/4431  . History of MRSA infection 12/09/2019  . Abscess of buttock, left 12/09/2019  . Abscess of right axilla 06/04/2019  . Lymphadenopathy, axillary 06/04/2019  . MRSA cellulitis 03/11/2014   Past Medical History:  Diagnosis  Date  . Depression   . MRSA (methicillin resistant Staphylococcus aureus)     History reviewed. No pertinent family history.  Past Surgical History:  Procedure Laterality Date  . DENTAL SURGERY    . INCISION AND DRAINAGE ABSCESS Right 03/14/2014   Procedure: INCISION AND DRAINAGE ABSCESS;  Surgeon: Nita Sells, MD;  Location: [facility];  Service: Orthopedics;  Laterality: Right;   Social History   Occupational History  . Not on file  Tobacco Use  . Smoking status: Never Smoker  . Smokeless tobacco: Never Used  Vaping Use  . Vaping Use: Never used  Substance and Sexual Activity  . Alcohol use: Yes    Alcohol/week: 12.0 standard drinks    Types: 12 Cans of beer per week    Comment: social  . Drug use: Yes    Types: Marijuana    Comment: "last  night"  . Sexual activity: Yes    Comment: condom

## 2020-04-17 ENCOUNTER — Encounter: Payer: Self-pay | Admitting: Gastroenterology

## 2020-04-28 ENCOUNTER — Ambulatory Visit (HOSPITAL_COMMUNITY)
Admission: RE | Admit: 2020-04-28 | Discharge: 2020-04-28 | Disposition: A | Discharge status: Home / Self Care | Payer: BC Managed Care – PPO | Source: Ambulatory Visit | Attending: Orthopaedic Surgery | Admitting: Orthopaedic Surgery

## 2020-04-28 DIAGNOSIS — S63501A Unspecified sprain of right wrist, initial encounter: Secondary | ICD-10-CM | POA: Diagnosis not present

## 2020-04-28 DIAGNOSIS — S61511A Laceration without foreign body of right wrist, initial encounter: Secondary | ICD-10-CM | POA: Insufficient documentation

## 2020-04-28 DIAGNOSIS — S56511A Strain of other extensor muscle, fascia and tendon at forearm level, right arm, initial encounter: Secondary | ICD-10-CM | POA: Diagnosis not present

## 2020-04-28 DIAGNOSIS — M7989 Other specified soft tissue disorders: Secondary | ICD-10-CM | POA: Diagnosis not present

## 2020-04-28 DIAGNOSIS — M19031 Primary osteoarthritis, right wrist: Secondary | ICD-10-CM | POA: Diagnosis not present

## 2020-04-28 NOTE — Progress Notes (Signed)
Needs f/u appt.  Thanks.

## 2020-05-09 ENCOUNTER — Ambulatory Visit (INDEPENDENT_AMBULATORY_CARE_PROVIDER_SITE_OTHER): Payer: BC Managed Care – PPO | Admitting: Orthopaedic Surgery

## 2020-05-09 ENCOUNTER — Ambulatory Visit: Payer: Self-pay

## 2020-05-09 ENCOUNTER — Encounter: Payer: Self-pay | Admitting: Orthopaedic Surgery

## 2020-05-09 DIAGNOSIS — M25531 Pain in right wrist: Secondary | ICD-10-CM | POA: Insufficient documentation

## 2020-05-09 MED ORDER — MELOXICAM 7.5 MG PO TABS: 7.5000 mg | ORAL_TABLET | Freq: Two times a day (BID) | ORAL | 2 refills | Status: DC | PRN

## 2020-05-09 MED ORDER — PREDNISONE 10 MG (21) PO TBPK: ORAL_TABLET | ORAL | 0 refills | Status: DC

## 2020-05-09 NOTE — Addendum Note (Signed)
Addended by: Hortencia Pilar on: 05/09/2020 09:28 AM   Modules accepted: Orders

## 2020-05-09 NOTE — Progress Notes (Signed)
Office Visit Note   Patient: Nicholas Rodriguez           Date of Birth: 11-03-1960           MRN: [id number] Visit Date: 05/09/2020              Requested by: Maximiano Coss, NP [place],  [place] [address] PCP: Maximiano Coss, NP   Assessment & Plan: Visit Diagnoses:  1. Pain in right wrist     Plan: MRI shows edema of the soft tissues.  There is a high-grade tenosynovitis of the ECU.  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  All these findings were discussed with patient and given the findings I have recommended immobilization with a wrist brace as well as an ultrasound-guided tendon sheath injection of the ECU as well as a course of prednisone and meloxicam.  I would like to reevaluate him in 4weeks.  Follow-Up Instructions: Return in about 4 weeks (around 06/06/2020).   Orders:  No orders of the defined types were placed in this encounter.  Meds ordered this encounter  Medications   predniSONE (STERAPRED UNI-PAK 21 TAB) 10 MG (21) TBPK tablet    Sig: Take as directed    Dispense:  21 tablet    Refill:  0   meloxicam (MOBIC) 7.5 MG tablet    Sig: Take 1 tablet (7.5 mg total) by mouth 2 (two) times daily as needed for pain.    Dispense:  30 tablet    Refill:  2      Procedures: No procedures performed   Clinical Data: No additional findings.   Subjective: Chief Complaint  Patient presents with   Right Wrist - Pain    Cayce is here for MRI review of the right wrist.  Denies any changes.   Review of Systems  Constitutional: Negative.   All other systems reviewed and are negative.    Objective: Vital Signs: There were no vitals taken for this visit.  Physical Exam Vitals and nursing note reviewed.  Constitutional:      Appearance: He is well-developed.  Pulmonary:     Effort: Pulmonary effort is normal.  Abdominal:     Palpations: Abdomen is soft.  Skin:    General: Skin is warm.  Neurological:     Mental  Status: He is alert and oriented to person, place, and time.  Psychiatric:        Behavior: Behavior normal.        Thought Content: Thought content normal.        Judgment: Judgment normal.     Ortho Exam Right wrist shows a healed transverse laceration just proximal to the ulnar and.  There is mild edema around this area. Specialty Comments:  No specialty comments available.  Imaging: No results found.   PMFS History: Patient Active Problem List   Diagnosis Date Noted   Pain in right wrist 05/09/2020   Laceration of right wrist with complication 27/78/2423   History of MRSA infection 12/09/2019   Abscess of buttock, left 12/09/2019   Abscess of right axilla 06/04/2019   Lymphadenopathy, axillary 06/04/2019   MRSA cellulitis 03/11/2014   Past Medical History:  Diagnosis Date   Depression    MRSA (methicillin resistant Staphylococcus aureus)     History reviewed. No pertinent family history.  Past Surgical History:  Procedure Laterality Date   DENTAL SURGERY     INCISION AND DRAINAGE ABSCESS Right 03/14/2014   Procedure: INCISION AND  DRAINAGE ABSCESS;  Surgeon: Nita Sells, MD;  Location: [facility];  Service: Orthopedics;  Laterality: Right;   Social History   Occupational History   Not on file  Tobacco Use   Smoking status: Never Smoker   Smokeless tobacco: Never Used  Vaping Use   Vaping Use: Never used  Substance and Sexual Activity   Alcohol use: Yes    Alcohol/week: 12.0 standard drinks    Types: 12 Cans of beer per week    Comment: social   Drug use: Yes    Types: Marijuana    Comment: "last night"   Sexual activity: Yes    Comment: condom

## 2020-05-09 NOTE — Progress Notes (Signed)
Subjective: He is here for right wrist ECU tendon sheath injection.  Chronic pain with recent MRI showing tenosynovitis.  Objective: He is tender to palpation over the ECU tendon.  Procedure: Ultrasound-guided ECU tendon injection: After sterile prep with Betadine, injected 3 cc 1% lidocaine without epinephrine and 40 milligrams methylprednisolone into the thickened tendon sheath without complication.  He had modest improvement during the anesthetic phase.  He will follow-up as directed.

## 2020-05-26 ENCOUNTER — Other Ambulatory Visit: Payer: Self-pay | Admitting: Gastroenterology

## 2020-05-26 ENCOUNTER — Ambulatory Visit (AMBULATORY_SURGERY_CENTER): Payer: Self-pay | Admitting: *Deleted

## 2020-05-26 ENCOUNTER — Other Ambulatory Visit: Payer: Self-pay

## 2020-05-26 ENCOUNTER — Encounter: Payer: Self-pay | Admitting: Gastroenterology

## 2020-05-26 VITALS — Ht 71.0 in | Wt 179.0 lb

## 2020-05-26 DIAGNOSIS — Z1211 Encounter for screening for malignant neoplasm of colon: Secondary | ICD-10-CM

## 2020-05-26 MED ORDER — NA SULFATE-K SULFATE-MG SULF 17.5-3.13-1.6 GM/177ML PO SOLN: 1.0000 | Freq: Once | ORAL | 0 refills | Status: AC

## 2020-05-26 NOTE — Progress Notes (Signed)
No egg or soy allergy known to patient  No issues with past sedation with any surgeries or procedures no intubation problems in the past  No FH of Malignant Hyperthermia No diet pills per patient No home 02 use per patient  No blood thinners per patient  Pt denies issues with constipation  No A fib or A flutter  EMMI video to pt or via [facility] guidelines implemented in PV today with Pt and RN   Pt is prone to have MRSA infections.  He states that it is usually somewhere where his clothes are rubbing.  Has had multiple infections in the past.  Explained to pt that he will need to let us know if he has an infection at time of procedure and that he will need to reschedule.  Pt verbalizes understanding.     Due to the COVID-19 pandemic we are asking patients to follow these guidelines. Please only bring one care partner. Please be aware that your care partner may wait in the car in the parking lot or if they feel like they will be too hot to wait in the car, they may wait in the lobby on the 4th floor. All care partners are required to wear a mask the entire time (we do not have any that we can provide them), they need to practice social distancing, and we will do a Covid check for all patient's and care partners when you arrive. Also we will check their temperature and your temperature. If the care partner waits in their car they need to stay in the parking lot the entire time and we will call them on their cell phone when the patient is ready for discharge so they can bring the car to the front of the building. Also all patient's will need to wear a mask into buildin

## 2020-06-06 ENCOUNTER — Ambulatory Visit (INDEPENDENT_AMBULATORY_CARE_PROVIDER_SITE_OTHER): Payer: BC Managed Care – PPO | Admitting: Orthopaedic Surgery

## 2020-06-06 ENCOUNTER — Encounter: Payer: Self-pay | Admitting: Orthopaedic Surgery

## 2020-06-06 VITALS — Ht 71.0 in | Wt 180.0 lb

## 2020-06-06 DIAGNOSIS — M25531 Pain in right wrist: Secondary | ICD-10-CM

## 2020-06-06 NOTE — Progress Notes (Signed)
Office Visit Note   Patient: Nicholas Rodriguez           Date of Birth: July 26, 1961           MRN: [id number] Visit Date: 06/06/2020              Requested by: Maximiano Coss, NP [place],  [place] [address] PCP: Maximiano Coss, NP   Assessment & Plan: Visit Diagnoses:  1. Pain in right wrist     Plan: Impression is improved right wrist ECU tenosynovitis status post injection immobilization.  At this point he can start to wean the brace and increase activity as tolerated.  Meloxicam as needed.  Questions encouraged and answered.  Follow-up as needed.  Follow-Up Instructions: Return if symptoms worsen or fail to improve.   Orders:  No orders of the defined types were placed in this encounter.  No orders of the defined types were placed in this encounter.     Procedures: No procedures performed   Clinical Data: No additional findings.   Subjective: Chief Complaint  Patient presents with  . Right Wrist - Follow-up    Nicholas Rodriguez follows up today for his right wrist pain.  We saw him about a month ago and he did an ultrasound-guided ECU sheath injection and immobilized with a wrist brace.  He does feel that he is doing much better.  Not taking any medications.   Review of Systems  Constitutional: Negative.   All other systems reviewed and are negative.    Objective: Vital Signs: Ht 5\' 11"  (1.803 m)   Wt 180 lb (81.6 kg)   BMI 25.10 kg/m   Physical Exam Vitals and nursing note reviewed.  Constitutional:      Appearance: He is well-developed.  Pulmonary:     Effort: Pulmonary effort is normal.  Abdominal:     Palpations: Abdomen is soft.  Skin:    General: Skin is warm.  Neurological:     Mental Status: He is alert and oriented to person, place, and time.  Psychiatric:        Behavior: Behavior normal.        Thought Content: Thought content normal.        Judgment: Judgment normal.     Ortho Exam Right wrist shows slight discomfort with  resisted pronation and no pain with resisted supination.  Good range of motion otherwise.  No pain and good strength. Specialty Comments:  No specialty comments available.  Imaging: No results found.   PMFS History: Patient Active Problem List   Diagnosis Date Noted  . Pain in right wrist 05/09/2020  . Laceration of right wrist with complication 18/29/9371  . History of MRSA infection 12/09/2019  . Abscess of buttock, left 12/09/2019  . Abscess of right axilla 06/04/2019  . Lymphadenopathy, axillary 06/04/2019  . MRSA cellulitis 03/11/2014   Past Medical History:  Diagnosis Date  . Anxiety   . Arthritis   . Depression   . MRSA (methicillin resistant Staphylococcus aureus)     Family History  Problem Relation Age of Onset  . Colon cancer Neg Hx   . Stomach cancer Neg Hx   . Esophageal cancer Neg Hx   . Colon polyps Neg Hx     Past Surgical History:  Procedure Laterality Date  . DENTAL SURGERY    . INCISION AND DRAINAGE ABSCESS Right 03/14/2014   Procedure: INCISION AND DRAINAGE ABSCESS;  Surgeon: Nita Sells, MD;  Location: [facility];  Service: Orthopedics;  Laterality: Right leg   Social History   Occupational History  . Not on file  Tobacco Use  . Smoking status: Never Smoker  . Smokeless tobacco: Never Used  Vaping Use  . Vaping Use: Never used  Substance and Sexual Activity  . Alcohol use: Yes    Alcohol/week: 12.0 standard drinks    Types: 12 Cans of beer per week  . Drug use: Yes    Types: Marijuana    Comment: "last night"  . Sexual activity: Yes    Comment: condom

## 2020-06-12 ENCOUNTER — Ambulatory Visit (AMBULATORY_SURGERY_CENTER): Payer: BC Managed Care – PPO | Admitting: Gastroenterology

## 2020-06-12 ENCOUNTER — Other Ambulatory Visit: Payer: Self-pay

## 2020-06-12 ENCOUNTER — Encounter: Payer: Self-pay | Admitting: Gastroenterology

## 2020-06-12 ENCOUNTER — Telehealth: Payer: Self-pay | Admitting: Gastroenterology

## 2020-06-12 VITALS — BP 106/64 | HR 51 | Temp 97.3°F | Resp 18 | Ht 71.0 in | Wt 179.0 lb

## 2020-06-12 DIAGNOSIS — D123 Benign neoplasm of transverse colon: Secondary | ICD-10-CM | POA: Diagnosis not present

## 2020-06-12 DIAGNOSIS — D125 Benign neoplasm of sigmoid colon: Secondary | ICD-10-CM

## 2020-06-12 DIAGNOSIS — Z1211 Encounter for screening for malignant neoplasm of colon: Secondary | ICD-10-CM

## 2020-06-12 DIAGNOSIS — D122 Benign neoplasm of ascending colon: Secondary | ICD-10-CM

## 2020-06-12 MED ORDER — SODIUM CHLORIDE 0.9 % IV SOLN: 500.0000 mL | Freq: Once | INTRAVENOUS | Status: DC

## 2020-06-12 NOTE — Telephone Encounter (Signed)
Called patient with no answer and no voicemail to leave a message. 

## 2020-06-12 NOTE — Progress Notes (Signed)
Pt. Reports no change in his medical or surgical history since his pre-visit 05/26/2020.

## 2020-06-12 NOTE — Patient Instructions (Signed)

## 2020-06-12 NOTE — Progress Notes (Signed)
Report given to PACU, vss 

## 2020-06-12 NOTE — Progress Notes (Signed)
Called to room to assist during endoscopic procedure.  Patient ID and intended procedure confirmed with present staff. Received instructions for my participation in the procedure from the performing physician.  

## 2020-06-12 NOTE — Op Note (Signed)
[place] Patient Name: Nicholas Rodriguez Procedure Date: 06/12/2020 1:56 PM MRN: [id number] Endoscopist: Ladene Artist , MD Age: 59 Referring MD:  Date of Birth: 1961/05/28 Gender: Male Account #: [id number] Procedure:                Colonoscopy Indications:              Screening for colorectal malignant neoplasm Medicines:                Monitored Anesthesia Care Procedure:                Pre-Anesthesia Assessment:                           - Prior to the procedure, a History and Physical                            was performed, and patient medications and                            allergies were reviewed. The patient's tolerance of                            previous anesthesia was also reviewed. The risks                            and benefits of the procedure and the sedation                            options and risks were discussed with the patient.                            All questions were answered, and informed consent                            was obtained. Prior Anticoagulants: The patient has                            taken no previous anticoagulant or antiplatelet                            agents. ASA Grade Assessment: I - A normal, healthy                            patient. After reviewing the risks and benefits,                            the patient was deemed in satisfactory condition to                            undergo the procedure.                           After obtaining informed consent, the colonoscope  was passed under direct vision. Throughout the                            procedure, the patient's blood pressure, pulse, and                            oxygen saturations were monitored continuously. The                            Colonoscope was introduced through the anus and                            advanced to the the cecum, identified by                            appendiceal orifice and ileocecal  valve. The                            ileocecal valve, appendiceal orifice, and rectum                            were photographed. The quality of the bowel                            preparation was good. The colonoscopy was performed                            without difficulty. The patient tolerated the                            procedure well. Scope In: 2:05:41 PM Scope Out: 2:22:10 PM Scope Withdrawal Time: 0 hours 14 minutes 40 seconds  Total Procedure Duration: 0 hours 16 minutes 29 seconds  Findings:                 The perianal and digital rectal examinations were                            normal.                           Three sessile polyps were found in the sigmoid                            colon, transverse colon and ascending colon. The                            polyps were 5 to 7 mm in size. These polyps were                            removed with a cold snare. Resection and retrieval                            were complete.  Internal hemorrhoids were found during                            retroflexion. The hemorrhoids were moderate and                            Grade I (internal hemorrhoids that do not prolapse).                           The exam was otherwise without abnormality on                            direct and retroflexion views.                           Multiple small-mouthed diverticula were found in                            the left colon. Peri-diverticular erythema was                            seen. There was no evidence of diverticular                            bleeding. Complications:            No immediate complications. Estimated blood loss:                            None. Estimated Blood Loss:     Estimated blood loss: none. Impression:               - Three 5 to 7 mm polyps in the sigmoid colon, in                            the transverse colon and in the ascending colon,                             removed with a cold snare. Resected and retrieved.                           - Internal hemorrhoids.                           - The examination was otherwise normal on direct                            and retroflexion views.                           - Mild diverticulosis in the left colon. Recommendation:           - Repeat colonoscopy date to be determined after                            pending pathology results are reviewed for  surveillance based on pathology results.                           - Patient has a contact number available for                            emergencies. The signs and symptoms of potential                            delayed complications were discussed with the                            patient. Return to normal activities tomorrow.                            Written discharge instructions were provided to the                            patient.                           - High fiber diet.                           - Continue present medications.                           - Await pathology results. Ladene Artist, MD 06/12/2020 2:26:20 PM This report has been signed electronically.

## 2020-06-13 ENCOUNTER — Telehealth: Payer: Self-pay | Admitting: Gastroenterology

## 2020-06-13 NOTE — Telephone Encounter (Signed)
Left message for patient to return my call.

## 2020-06-13 NOTE — Telephone Encounter (Signed)
Pt is wanting to inform the nurse he is doing ok.

## 2020-06-13 NOTE — Telephone Encounter (Signed)
Called patient with no answer and no voicemail to leave a message. 

## 2020-06-13 NOTE — Telephone Encounter (Signed)
Pt's spouse is requesting a call back from a nurse 

## 2020-06-14 ENCOUNTER — Telehealth: Payer: Self-pay | Admitting: *Deleted

## 2020-06-14 NOTE — Telephone Encounter (Signed)
  Follow up Call-  Call back number 06/12/2020  Post procedure Call Back phone  # [phone]  Permission to leave phone message Yes  Some recent data might be hidden     Patient questions:  Do you have a fever, pain , or abdominal swelling? No. Pain Score  0 *  Have you tolerated food without any problems? Yes.    Have you been able to return to your normal activities? Yes.    Do you have any questions about your discharge instructions: Diet   No. Medications  No. Follow up visit  No.  Do you have questions or concerns about your Care? No.  Actions: * If pain score is 4 or above: No action needed, pain <4.  1. Have you developed a fever since your procedure? no  2.   Have you had an respiratory symptoms (SOB or cough) since your procedure? no  3.   Have you tested positive for COVID 19 since your procedure no  4.   Have you had any family members/close contacts diagnosed with the COVID 19 since your procedure?  no   If yes to any of these questions please route to Joylene John, RN and Joella Prince, RN

## 2020-06-14 NOTE — Telephone Encounter (Signed)
Patient's wife states she was told by the insurance company yesterday that if we contact [employer] at [phone] and get a pre-authorization on the [place], that they will be reimbursed. Informed patient's wife that we have heard this from insurance companies in the past and it's not necessarily true. The insurance companies always ask what the patient has tried and failed as far as other preps and of course it being a prep kit..the patient has not tried anything else. Informed patient's wife I will call though and attempt [place]. Called [employer] [facility] and answered questions. They did inform me the outcome does not look good for approval and reimbursement but will contact us and patient when they have an answer. Called and left message for patients wife informing her of their response.

## 2020-06-14 NOTE — Telephone Encounter (Signed)
Left message on patient's wife voicemail to call our office back.

## 2020-06-19 ENCOUNTER — Encounter: Payer: Self-pay | Admitting: Gastroenterology

## 2021-10-09 DIAGNOSIS — T148XXA Other injury of unspecified body region, initial encounter: Secondary | ICD-10-CM | POA: Diagnosis not present

## 2021-10-09 DIAGNOSIS — W25XXXA Contact with sharp glass, initial encounter: Secondary | ICD-10-CM | POA: Diagnosis not present

## 2021-10-09 DIAGNOSIS — M79644 Pain in right finger(s): Secondary | ICD-10-CM | POA: Diagnosis not present

## 2021-10-09 DIAGNOSIS — S61224A Laceration with foreign body of right ring finger without damage to nail, initial encounter: Secondary | ICD-10-CM | POA: Diagnosis not present

## 2021-10-09 DIAGNOSIS — S61218A Laceration without foreign body of other finger without damage to nail, initial encounter: Secondary | ICD-10-CM | POA: Diagnosis not present

## 2021-10-10 ENCOUNTER — Other Ambulatory Visit: Payer: Self-pay

## 2021-10-10 ENCOUNTER — Encounter (HOSPITAL_BASED_OUTPATIENT_CLINIC_OR_DEPARTMENT_OTHER): Payer: Self-pay | Admitting: Orthopedic Surgery

## 2021-10-10 ENCOUNTER — Other Ambulatory Visit: Payer: Self-pay | Admitting: Orthopedic Surgery

## 2021-10-10 DIAGNOSIS — S61224A Laceration with foreign body of right ring finger without damage to nail, initial encounter: Secondary | ICD-10-CM | POA: Diagnosis not present

## 2021-10-10 DIAGNOSIS — S61214A Laceration without foreign body of right ring finger without damage to nail, initial encounter: Secondary | ICD-10-CM | POA: Diagnosis not present

## 2021-10-15 ENCOUNTER — Ambulatory Visit (HOSPITAL_BASED_OUTPATIENT_CLINIC_OR_DEPARTMENT_OTHER)
Admission: RE | Admit: 2021-10-15 | Payer: BC Managed Care – PPO | Source: Home / Self Care | Admitting: Orthopedic Surgery

## 2021-10-15 SURGERY — EXCISION METACARPAL MASS
Anesthesia: Choice | Laterality: Right

## 2021-11-05 DIAGNOSIS — S71111A Laceration without foreign body, right thigh, initial encounter: Secondary | ICD-10-CM | POA: Diagnosis not present

## 2021-11-19 DIAGNOSIS — S81011A Laceration without foreign body, right knee, initial encounter: Secondary | ICD-10-CM | POA: Diagnosis not present

## 2022-03-05 IMAGING — MR MR WRIST*R* W/O CM
5 series · 40 of 40 positions shown · non-contrast
Comparison: None.

CLINICAL DATA: Persistent right wrist pain and weakness since
traumatic laceration from a piece of glass at work about 2 months
ago.

EXAM:
MR OF THE RIGHT WRIST WITHOUT CONTRAST
TECHNIQUE: Multiplanar, multisequence MR imaging of the right wrist was
performed. No intravenous contrast was administered.

[Series 4: T1 · coronal · right · 3.0mm · 0.47mm/px · 7 of 18 slices shown (1 of 2)]
[im 1/18]
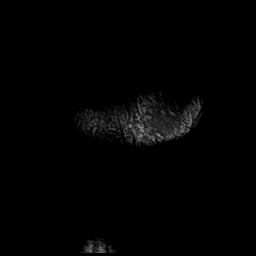
[im 3/18]
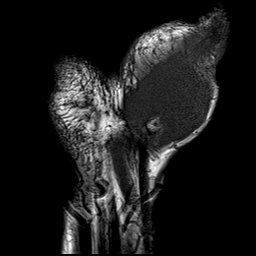
[im 6/18]
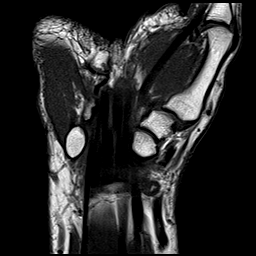
[im 9/18]
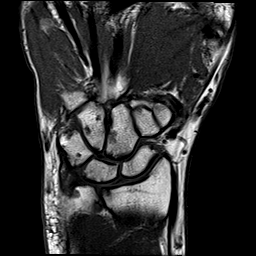
[im 12/18]
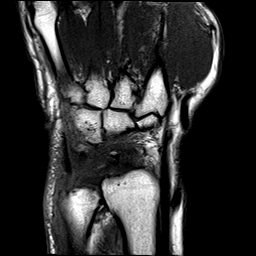
[im 15/18]
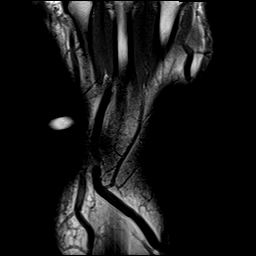
[im 18/18]
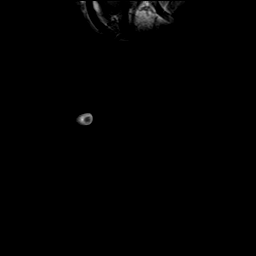

[Series 5: T2 fat-sat · coronal · right · 3.0mm · 0.47mm/px · 7 of 18 slices shown (1 of 2)]
[im 1/18]
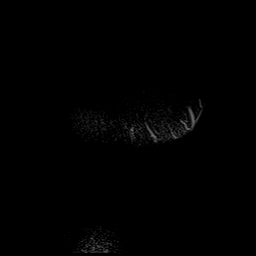
[im 3/18]
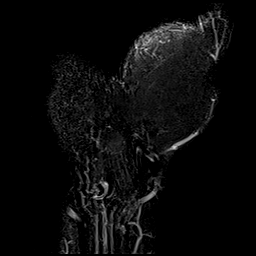
[im 6/18]
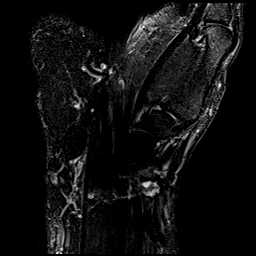
[im 9/18]
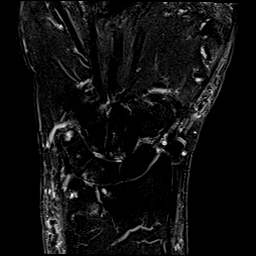
[im 12/18]
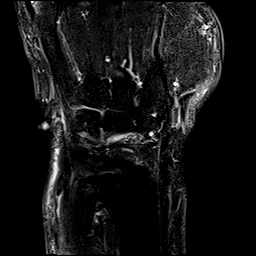
[im 15/18]
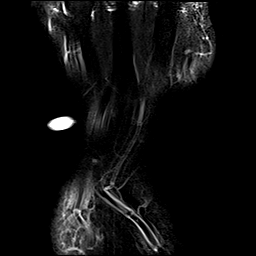
[im 18/18]
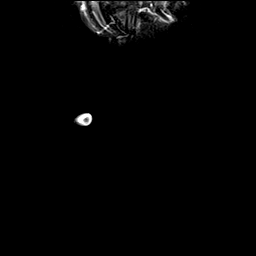

[Series 8: PD fat-sat · oblique · right · 3.0mm · 0.38mm/px · 10 of 24 slices shown]
[im 1/24]
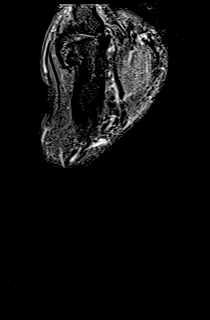
[im 3/24]
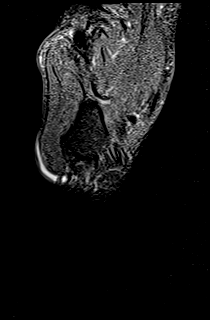
[im 6/24]
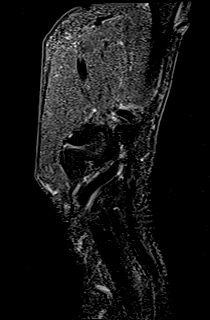
[im 8/24]
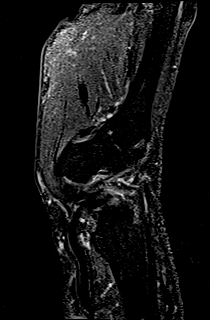
[im 11/24]
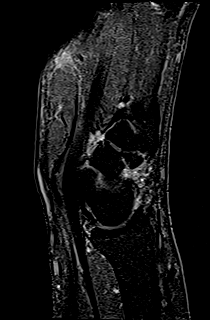
[im 13/24]
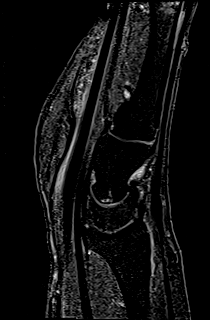
[im 16/24]
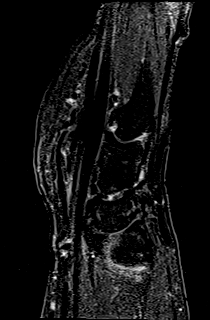
[im 18/24]
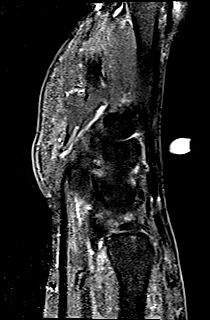
[im 21/24]
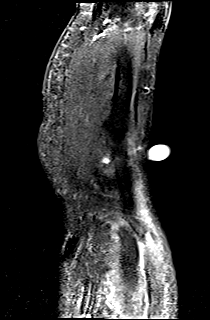
[im 24/24]
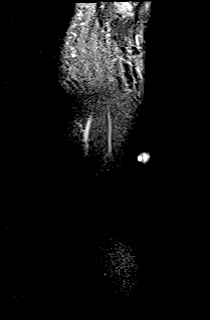

[Series 100: T2 fat-sat · axial · right · 4.0mm · 0.47mm/px · z∈[-26,+45]mm · 8 of 20 slices shown (2 of 2)]
[im 1/20]
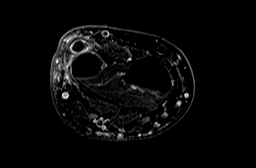
[im 3/20]
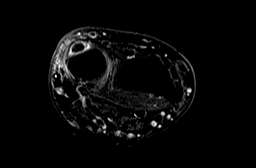
[im 6/20]
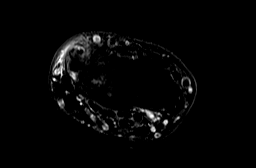
[im 9/20]
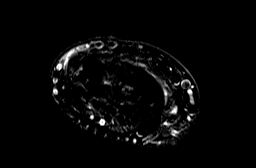
[im 11/20]
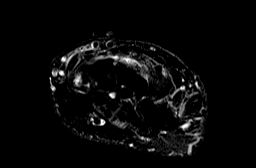
[im 14/20]
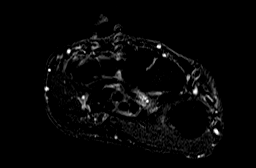
[im 17/20]
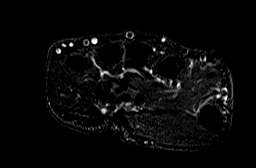
[im 20/20]
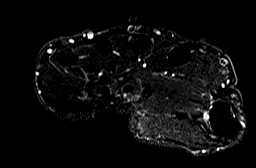

[Series 101: T1 · axial · right · 4.0mm · 0.23mm/px · z∈[-26,+45]mm · 8 of 20 slices shown (2 of 2)]
[im 1/20]
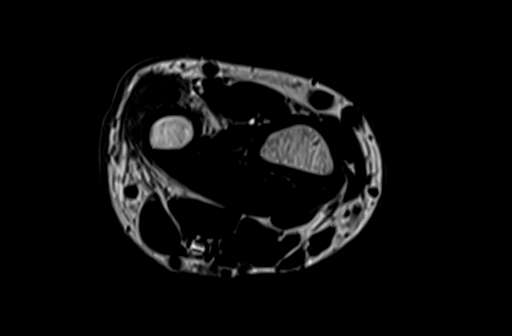
[im 3/20]
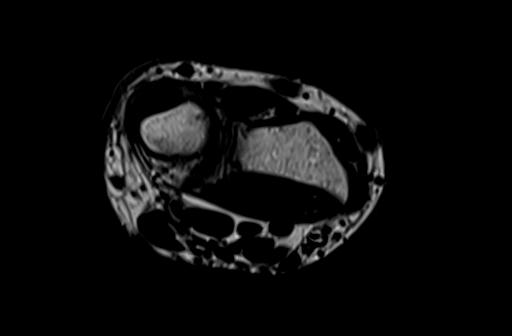
[im 6/20]
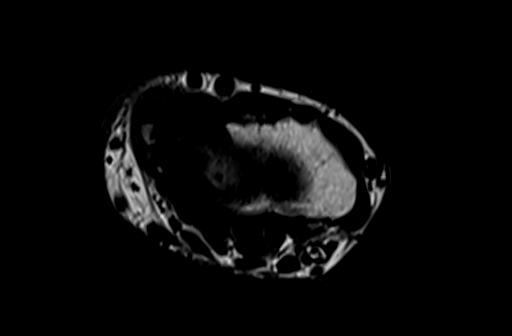
[im 9/20]
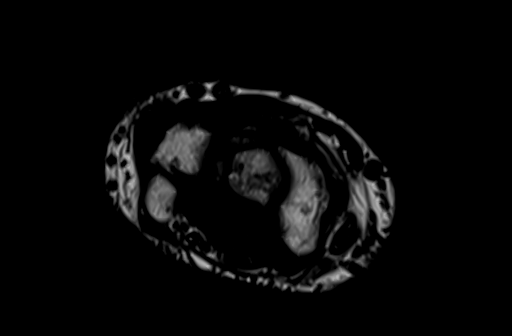
[im 11/20]
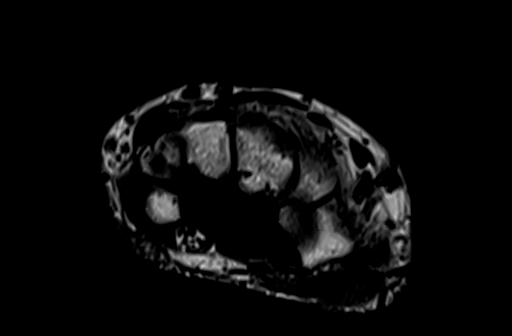
[im 14/20]
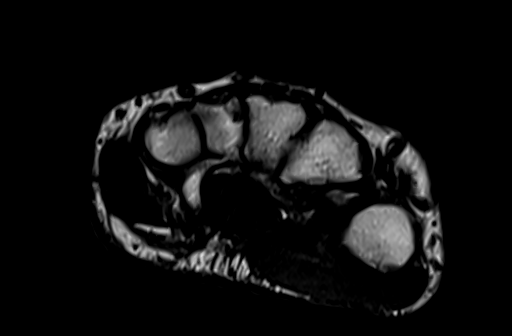
[im 17/20]
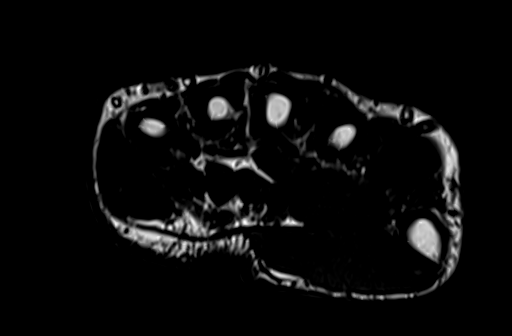
[im 20/20]
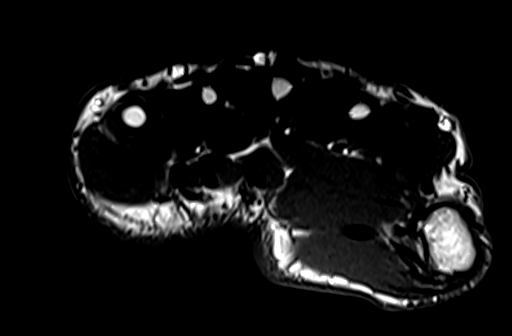

[40 of 40 positions shown; findings below may reference images not displayed]

FINDINGS: Ligaments: Intact scapholunate and lunotriquetral ligaments.

Triangular fibrocartilage: Degenerative maceration and
full-thickness tear of the articular disc.

Tendons: Thickening, increased intermediate signal, and high-grade
partial tear of the extensor carpi ulnaris tendon within and just
distal to the ulnar groove (series 100, images 15 and 16). Small
amount of fluid in the tendon sheath. No tendon subluxation.
Remaining flexor and extensor tendons are intact.

Carpal tunnel/median nerve: Normal carpal tunnel. Normal median
nerve.

Guyon's canal: Normal.

Joint/cartilage: No joint effusion. Mild radiocarpal, midcarpal, and
first CMC joint degenerative changes.

Bones/carpal alignment: Mild marrow edema in the distal ulna and
ulnar aspect of the proximal lunate. Normal alignment. No acute
fracture or dislocation. No suspicious bone lesion.

Other: Mild soft tissue swelling overlying the extensor carpi
ulnaris tendon. No fluid collection or hematoma. No soft tissue
mass.
IMPRESSION: 1. Tendinosis and high-grade partial tear of the extensor carpi
ulnaris tendon within and just distal to the ulnar groove with mild
tenosynovitis. No complete tear.
2. Findings suggestive of ulnar impaction syndrome with degenerative
maceration and full-thickness tear of the triangular fibrocartilage
articular disc.
3. Mild wrist and first CMC joint degenerative changes.
